# Patient Record
Sex: Male | Born: 1975 | Race: White | Hispanic: No | Marital: Married | Smoking: Never smoker
Health system: Southern US, Community
[De-identification: ages and names within clinical notes are randomized; demographics above are authoritative.]

## PROBLEM LIST (undated history)

## (undated) DIAGNOSIS — M1990 Unspecified osteoarthritis, unspecified site: Secondary | ICD-10-CM

---

## 2006-01-19 ENCOUNTER — Emergency Department (HOSPITAL_COMMUNITY): Admission: EM | Admit: 2006-01-19 | Discharge: 2006-01-19 | Payer: Self-pay | Admitting: Family Medicine

## 2010-01-04 ENCOUNTER — Ambulatory Visit (HOSPITAL_COMMUNITY): Admission: RE | Admit: 2010-01-04 | Discharge: 2010-01-04 | Payer: Self-pay | Admitting: Family Medicine

## 2010-02-02 ENCOUNTER — Ambulatory Visit (HOSPITAL_COMMUNITY): Admission: RE | Admit: 2010-02-02 | Discharge: 2010-02-02 | Payer: Self-pay | Admitting: Family Medicine

## 2012-07-31 ENCOUNTER — Encounter: Payer: Self-pay | Admitting: Family Medicine

## 2012-07-31 ENCOUNTER — Ambulatory Visit (INDEPENDENT_AMBULATORY_CARE_PROVIDER_SITE_OTHER): Payer: BLUE CROSS/BLUE SHIELD | Admitting: Family Medicine

## 2012-07-31 VITALS — BP 110/72 | Temp 98.3°F | Ht 72.0 in | Wt 174.8 lb

## 2012-07-31 DIAGNOSIS — S39012A Strain of muscle, fascia and tendon of lower back, initial encounter: Secondary | ICD-10-CM

## 2012-07-31 DIAGNOSIS — M546 Pain in thoracic spine: Secondary | ICD-10-CM

## 2012-07-31 MED ORDER — DICLOFENAC SODIUM 75 MG PO TBEC: 75.0000 mg | DELAYED_RELEASE_TABLET | Freq: Two times a day (BID) | ORAL | Status: DC

## 2012-07-31 MED ORDER — CHLORZOXAZONE 500 MG PO TABS: 500.0000 mg | ORAL_TABLET | Freq: Four times a day (QID) | ORAL | Status: DC | PRN

## 2012-07-31 NOTE — Patient Instructions (Signed)

## 2012-07-31 NOTE — Progress Notes (Signed)
  Subjective:    Patient ID: Aaron Powers, male    DOB: 1976/04/21, 37 y.o.   MRN: [id number]  Back Pain This is a recurrent problem. The current episode started in the past 7 days. The problem occurs constantly. The problem has been waxing and waning since onset. The pain is present in the lumbar spine. The quality of the pain is described as aching and cramping. The pain does not radiate. The pain is at a severity of 5/10. The pain is moderate. The symptoms are aggravated by bending. Stiffness is present all day. Pertinent negatives include no abdominal pain. Leg pain: some rad to right leg. Risk factors include poor posture. He has tried NSAIDs and analgesics for the symptoms. The treatment provided no relief.      Review of Systems  Gastrointestinal: Negative for abdominal pain.  Musculoskeletal: Positive for back pain.  All other systems reviewed and are negative.       Objective:   Physical Exam  Constitutional: He appears well-developed and well-nourished.  HENT:  Head: Normocephalic and atraumatic.  Eyes: EOM are normal.  Neck: Normal range of motion.  Cardiovascular: Normal rate and regular rhythm.   Pulmonary/Chest: Effort normal.  Musculoskeletal:  Will lumbar tenderness. Left greater than right. Negative straight leg raise. Negative spinal tenderness.          Assessment & Plan:  Acute lumbar strain. Plan chlorzoxazone 500 3 times a day when necessary. Voltaren 75 twice a day when necessary. Local measures discussed. Exercises encouraged.

## 2012-08-01 ENCOUNTER — Encounter: Payer: Self-pay | Admitting: Family Medicine

## 2013-05-04 ENCOUNTER — Telehealth: Payer: Self-pay | Admitting: Family Medicine

## 2013-05-04 MED ORDER — ONDANSETRON 4 MG PO TBDP: 4.0000 mg | ORAL_TABLET | Freq: Four times a day (QID) | ORAL | Status: DC | PRN

## 2013-05-04 NOTE — Telephone Encounter (Signed)
Same rx as bro

## 2013-05-04 NOTE — Telephone Encounter (Signed)
Pt has a stomach bug, wants some zofran called in please to [facility]

## 2013-05-04 NOTE — Telephone Encounter (Signed)
Patient notified and med sent in 

## 2014-02-08 ENCOUNTER — Encounter: Payer: Self-pay | Admitting: Family Medicine

## 2014-02-08 ENCOUNTER — Ambulatory Visit (INDEPENDENT_AMBULATORY_CARE_PROVIDER_SITE_OTHER): Payer: BLUE CROSS/BLUE SHIELD | Admitting: Family Medicine

## 2014-02-08 VITALS — Ht 72.0 in | Wt 177.2 lb

## 2014-02-08 DIAGNOSIS — S83512A Sprain of anterior cruciate ligament of left knee, initial encounter: Secondary | ICD-10-CM

## 2014-02-08 DIAGNOSIS — S83509A Sprain of unspecified cruciate ligament of unspecified knee, initial encounter: Secondary | ICD-10-CM

## 2014-02-08 NOTE — Progress Notes (Signed)
   Subjective:    Patient ID: Aaron Powers, male    DOB: 09/14/1975, 38 y.o.   MRN: [id number]  HPI Patient arrives with left knee pain since yesterday evening. He is 38 years of age in good health he is active with his sons playing soccer. He states he was playing outside stepped in turn when he felt a pop in the knee along with swelling pain and discomfort has never had this before.  Review of Systems     Objective:   Physical Exam Lungs clear heart regular A full knee exam was done. Right knee is normal. Left knee has some swelling moderate joint line tenderness bilateral and has significant laxity with anterior drawer maneuver       Assessment & Plan:  Anterior cruciate ligament injury left knee needs referral to orthopedics. Will discuss case with Dr. Romeo AppleHarrison in set him up. Patient is a good surgical candidate

## 2014-02-09 ENCOUNTER — Telehealth: Payer: Self-pay | Admitting: Family Medicine

## 2014-02-09 NOTE — Telephone Encounter (Signed)
Appointment scheduled for tomorrow morning at 10:15 am per Okey Regalarol at Dr. Romeo AppleHarrison.

## 2014-02-09 NOTE — Telephone Encounter (Signed)
Calling to check on referral to ortho.

## 2014-02-09 NOTE — Telephone Encounter (Signed)
Please try to get appt this week with Dr Romeo AppleHarrison, ( I can talk with him if need be) pt has new ACL injury

## 2014-02-10 ENCOUNTER — Ambulatory Visit (INDEPENDENT_AMBULATORY_CARE_PROVIDER_SITE_OTHER): Payer: BC Managed Care – PPO

## 2014-02-10 ENCOUNTER — Encounter: Payer: Self-pay | Admitting: Orthopedic Surgery

## 2014-02-10 ENCOUNTER — Ambulatory Visit (INDEPENDENT_AMBULATORY_CARE_PROVIDER_SITE_OTHER): Payer: BC Managed Care – PPO | Admitting: Orthopedic Surgery

## 2014-02-10 VITALS — HR 80 | Ht 72.0 in | Wt 177.2 lb

## 2014-02-10 DIAGNOSIS — M25562 Pain in left knee: Secondary | ICD-10-CM | POA: Insufficient documentation

## 2014-02-10 DIAGNOSIS — M238X2 Other internal derangements of left knee: Secondary | ICD-10-CM

## 2014-02-10 DIAGNOSIS — M238X9 Other internal derangements of unspecified knee: Secondary | ICD-10-CM | POA: Insufficient documentation

## 2014-02-10 DIAGNOSIS — S83249A Other tear of medial meniscus, current injury, unspecified knee, initial encounter: Secondary | ICD-10-CM | POA: Insufficient documentation

## 2014-02-10 DIAGNOSIS — S83242A Other tear of medial meniscus, current injury, left knee, initial encounter: Secondary | ICD-10-CM

## 2014-02-10 NOTE — Progress Notes (Signed)
Chief Complaint  Patient presents with  . Knee Injury    Left knee injury while playing with children outside, DOI 02/07/14    Subjective:    Aaron MelenaRaleigh Powers is a 38 y.o. male who presents with a knee injury involving the left knee. Onset was Initially 20 years ago he had an anterior cruciate ligament injury which was treated with bracing and therapy. He presents now with a recent onset of a new injury twisting his left knee pivoting with frequent giving way episode since that time. This injury occurred on September 28. Inciting event: twisting injury while Helping his children practice soccer. Current symptoms include: giving out and swelling. Pain is aggravated by lateral movements, pivoting and squatting. Patient has had prior knee problems. Evaluation to date: none. Treatment to date: none.  The following portions of the patient's history were reviewed and updated as appropriate: allergies, current medications, past family history, past medical history, past social history, past surgical history and problem list.   Review of Systems A comprehensive review of systems was negative.   Objective:    Ht 6' (1.829 m)  Wt 177 lb 3.2 oz (80.377 kg)  BMI 24.03 kg/m2 He is healthy well-developed well-nourished grooming and hygiene are normal he is oriented x3 his mood and affect are pleasant and normal as well.   UPPEREXTREMITIES: He has normal range of motion stability strength and alignment and skin in the upper extremities  Right knee: normal, no effusion, full active range of motion, no joint line tenderness, ligamentous structures intact. and Strength normal  Left knee:  Trace positive joint effusion. Medial joint line tenderness. Full range of motion. 2+ Lachman. Positive McMurray's for medial joint line pain and clicking. Motor strength normal. Skin normal.   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

## 2014-02-14 ENCOUNTER — Ambulatory Visit (HOSPITAL_COMMUNITY)
Admission: RE | Admit: 2014-02-14 | Discharge: 2014-02-14 | Disposition: A | Discharge status: Home / Self Care | Payer: BC Managed Care – PPO | Source: Ambulatory Visit | Attending: Orthopedic Surgery | Admitting: Orthopedic Surgery

## 2014-02-14 ENCOUNTER — Ambulatory Visit (HOSPITAL_COMMUNITY): Payer: BC Managed Care – PPO

## 2014-02-14 DIAGNOSIS — S83282A Other tear of lateral meniscus, current injury, left knee, initial encounter: Secondary | ICD-10-CM | POA: Diagnosis not present

## 2014-02-14 DIAGNOSIS — X58XXXA Exposure to other specified factors, initial encounter: Secondary | ICD-10-CM | POA: Insufficient documentation

## 2014-02-14 DIAGNOSIS — M25562 Pain in left knee: Secondary | ICD-10-CM | POA: Insufficient documentation

## 2014-02-14 DIAGNOSIS — S83242A Other tear of medial meniscus, current injury, left knee, initial encounter: Secondary | ICD-10-CM | POA: Diagnosis not present

## 2014-02-15 ENCOUNTER — Ambulatory Visit (HOSPITAL_COMMUNITY): Payer: BC Managed Care – PPO

## 2014-02-17 ENCOUNTER — Ambulatory Visit (INDEPENDENT_AMBULATORY_CARE_PROVIDER_SITE_OTHER): Payer: BC Managed Care – PPO | Admitting: Orthopedic Surgery

## 2014-02-17 ENCOUNTER — Encounter: Payer: Self-pay | Admitting: Orthopedic Surgery

## 2014-02-17 VITALS — BP 141/83 | Ht 72.0 in | Wt 177.2 lb

## 2014-02-17 DIAGNOSIS — M23304 Other meniscus derangements, unspecified medial meniscus, left knee: Secondary | ICD-10-CM

## 2014-02-17 DIAGNOSIS — M23301 Other meniscus derangements, unspecified lateral meniscus, left knee: Secondary | ICD-10-CM

## 2014-02-17 DIAGNOSIS — M23307 Other meniscus derangements, unspecified meniscus, left knee: Secondary | ICD-10-CM

## 2014-02-17 DIAGNOSIS — M23322 Other meniscus derangements, posterior horn of medial meniscus, left knee: Secondary | ICD-10-CM

## 2014-02-17 DIAGNOSIS — M2352 Chronic instability of knee, left knee: Secondary | ICD-10-CM

## 2014-02-17 NOTE — Patient Instructions (Signed)
SURGERY 02/23/14   You have decided to proceed with operative arthroscopy of the knee. You have decided not to continue with nonoperative measures such as but not limited to oral medication, weight loss, activity modification, physical therapy, bracing, or injection.  We will perform operative arthroscopy of the knee. Some of the risks associated with arthroscopic surgery of the knee include but are not limited to Bleeding Infection Swelling Stiffness Blood clot Pain  If you're not comfortable with these risks and would like to continue with nonoperative treatment please let Dr. Romeo AppleHarrison know prior to your surgery.

## 2014-02-18 ENCOUNTER — Encounter: Payer: Self-pay | Admitting: Orthopedic Surgery

## 2014-02-18 ENCOUNTER — Telehealth: Payer: Self-pay | Admitting: Orthopedic Surgery

## 2014-02-18 DIAGNOSIS — M2350 Chronic instability of knee, unspecified knee: Secondary | ICD-10-CM | POA: Insufficient documentation

## 2014-02-18 DIAGNOSIS — M23329 Other meniscus derangements, posterior horn of medial meniscus, unspecified knee: Secondary | ICD-10-CM | POA: Insufficient documentation

## 2014-02-18 DIAGNOSIS — M23302 Other meniscus derangements, unspecified lateral meniscus, unspecified knee: Secondary | ICD-10-CM | POA: Insufficient documentation

## 2014-02-18 NOTE — Patient Instructions (Addendum)
Marcanthony Fournet  02/18/2014   Your procedure is scheduled on:  02/23/14  [facility] at [facility]:25 AM.  Call this number if you have problems the morning of surgery: [phone]   Remember:   Do not eat food or drink liquids after midnight.   Take these medicines the morning of surgery with A SIP OF WATER: none   Do not wear jewelry, make-up or nail polish.  Do not wear lotions, powders, or perfumes.  Do not shave 48 hours prior to surgery. Men may shave face and neck.  Do not bring valuables to the hospital.  [facility] is not responsible for any belongings or valuables.               Contacts, dentures or bridgework may not be worn into surgery.  Leave suitcase in the car. After surgery it may be brought to your room.  For patients admitted to the hospital, discharge time is determined by your treatment team.               Patients discharged the day of surgery will not be allowed to drive home.    Special Instructions: Shower using Hibiclens bath the night before surgery and the morning of surgery.   Please read over the following fact sheets that you were given: Pain Booklet and Anesthesia Post-op Instructions    Arthroscopic Procedure, Knee An arthroscopic procedure can find what is wrong with your knee. PROCEDURE Arthroscopy is a surgical technique that allows your orthopedic surgeon to diagnose and treat your knee injury with accuracy. They will look into your knee through a small instrument. This is almost like a small (pencil sized) telescope. Because arthroscopy affects your knee less than open knee surgery, you can anticipate a more rapid recovery. Taking an active role by following your caregiver's instructions will help with rapid and complete recovery. Use crutches, rest, elevation, ice, and knee exercises as instructed. The length of recovery depends on various factors including type of injury, age, physical condition, medical conditions, and your  rehabilitation. Your knee is the joint between the large bones (femur and tibia) in your leg. Cartilage covers these bone ends which are smooth and slippery and allow your knee to bend and move smoothly. Two menisci, thick, semi-lunar shaped pads of cartilage which form a rim inside the joint, help absorb shock and stabilize your knee. Ligaments bind the bones together and support your knee joint. Muscles move the joint, help support your knee, and take stress off the joint itself. Because of this all programs and physical therapy to rehabilitate an injured or repaired knee require rebuilding and strengthening your muscles. AFTER THE PROCEDURE  After the procedure, you will be moved to a recovery area until most of the effects of the medication have worn off. Your caregiver will discuss the test results with you.  Only take over-the-counter or prescription medicines for pain, discomfort, or fever as directed by your caregiver. SEEK MEDICAL CARE IF:   You have increased bleeding from your wounds.  You see redness, swelling, or have increasing pain in your wounds.  You have pus coming from your wound.  You have an oral temperature above 102 F (38.9 C).  You notice a bad smell coming from the wound or dressing.  You have severe pain with any motion of your knee. SEEK IMMEDIATE MEDICAL CARE IF:   You develop a rash.  You have difficulty breathing.  You have any allergic problems. Document Released: 04/26/2000  Document Revised: 07/22/2011 Document Reviewed: 11/18/2007 [facility] Patient Information 2015 [place]. This information is not intended to replace advice given to you by your health care provider. Make sure you discuss any questions you have with your health care provider.    PATIENT INSTRUCTIONS POST-ANESTHESIA  IMMEDIATELY FOLLOWING SURGERY:  Do not drive or operate machinery for the first twenty four hours after surgery.  Do not make any important decisions for twenty  four hours after surgery or while taking narcotic pain medications or sedatives.  If you develop intractable nausea and vomiting or a severe headache please notify your doctor immediately.  FOLLOW-UP:  Please make an appointment with your surgeon as instructed. You do not need to follow up with anesthesia unless specifically instructed to do so.  WOUND CARE INSTRUCTIONS (if applicable):  Keep a dry clean dressing on the anesthesia/puncture wound site if there is drainage.  Once the wound has quit draining you may leave it open to air.  Generally you should leave the bandage intact for twenty four hours unless there is drainage.  If the epidural site drains for more than 36-48 hours please call the anesthesia department.  QUESTIONS?:  Please feel free to call your physician or the hospital operator if you have any questions, and they will be happy to assist you.

## 2014-02-18 NOTE — Progress Notes (Signed)
Chief Complaint  Patient presents with  . Results    review MRI results left knee    Reevaluation with MRI report regarding patient's left knee. 38 year old male left knee injury many years ago repeat injury 01/30/2014. History of remote anterior cruciate ligament tear without reconstruction. Presents after twisting injury.  Symptoms have not changed since his evaluation on October 1. 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   Complex tear posterior horn medial meniscus with horizontal tearing seen in the body of the medial meniscus. A small meniscal fragment is displaced over the root of the posterior horn.   Large radial tear posterior horn lateral meniscus. The body of the lateral meniscus demonstrates complex and diffuse degenerative tearing.   Chronic, complete ACL tear.  We basically discussed 2 options one complete anterior cruciate ligament reconstruction with meniscal work as needed with the understanding that the patient would still have arthritic symptoms and that his recovery would be a year with 3 months out of work.  OR meniscectomies understanding that his knee would return to its baseline status prior to this most recent injury  He opted for arthroscopic meniscectomies of the left knee medial and lateral at his convenience.  We discussed the following  You have decided to proceed with operative arthroscopy of the knee. You have decided not to continue with nonoperative measures such as but not limited to oral medication, weight loss, activity modification, physical therapy, bracing, or injection.  We will perform operative arthroscopy of the knee. Some of the risks associated with arthroscopic surgery of the knee include but are not limited to Bleeding Infection Swelling Stiffness Blood clot Pain  If you're not comfortable with these risks and would like to continue  with nonoperative treatment please let Dr. Romeo AppleHarrison know prior to your surgery.  Time spent 15 minutes

## 2014-02-18 NOTE — Telephone Encounter (Signed)
Regarding out-patient surgery scheduled 02/23/14, contacted insurer, [employer], at [phone]; per Gifford ShaveNicole B, no pre-authorization required for out-patient procedures; her name and today's date, 02/28/14 for reference.

## 2014-02-21 ENCOUNTER — Encounter (HOSPITAL_COMMUNITY)
Admission: RE | Admit: 2014-02-21 | Discharge: 2014-02-21 | Disposition: A | Discharge status: Home / Self Care | Payer: BC Managed Care – PPO | Source: Ambulatory Visit | Attending: Orthopedic Surgery | Admitting: Orthopedic Surgery

## 2014-02-21 ENCOUNTER — Encounter (HOSPITAL_COMMUNITY): Payer: Self-pay

## 2014-02-21 ENCOUNTER — Encounter (HOSPITAL_COMMUNITY): Payer: Self-pay | Admitting: Pharmacy Technician

## 2014-02-21 DIAGNOSIS — Y9366 Activity, soccer: Secondary | ICD-10-CM | POA: Diagnosis not present

## 2014-02-21 DIAGNOSIS — Y998 Other external cause status: Secondary | ICD-10-CM | POA: Diagnosis not present

## 2014-02-21 DIAGNOSIS — S83282A Other tear of lateral meniscus, current injury, left knee, initial encounter: Secondary | ICD-10-CM | POA: Diagnosis present

## 2014-02-21 DIAGNOSIS — M25862 Other specified joint disorders, left knee: Secondary | ICD-10-CM | POA: Diagnosis not present

## 2014-02-21 DIAGNOSIS — S83242A Other tear of medial meniscus, current injury, left knee, initial encounter: Secondary | ICD-10-CM | POA: Diagnosis not present

## 2014-02-21 DIAGNOSIS — M94262 Chondromalacia, left knee: Secondary | ICD-10-CM | POA: Diagnosis not present

## 2014-02-21 DIAGNOSIS — Y92007 Garden or yard of unspecified non-institutional (private) residence as the place of occurrence of the external cause: Secondary | ICD-10-CM | POA: Diagnosis not present

## 2014-02-21 LAB — BASIC METABOLIC PANEL
ANION GAP: 12 (ref 5–15)
BUN: 17 mg/dL (ref 6–23)
CALCIUM: 9.5 mg/dL (ref 8.4–10.5)
CHLORIDE: 101 meq/L (ref 96–112)
CO2: 25 mEq/L (ref 19–32)
CREATININE: 0.83 mg/dL (ref 0.50–1.35)
GFR calc Af Amer: >90 mL/min (ref >90)
Glucose, Bld: 139 mg/dL — ABNORMAL HIGH (ref 70–99)
POTASSIUM: 3.8 meq/L (ref 3.7–5.3)
Sodium: 138 mEq/L (ref 137–147)

## 2014-02-21 LAB — HEMOGLOBIN AND HEMATOCRIT, BLOOD
HEMATOCRIT: 41.1 % (ref 39.0–52.0)
HEMOGLOBIN: 14.7 g/dL (ref 13.0–17.0)

## 2014-02-21 NOTE — Pre-Procedure Instructions (Signed)
Patient given information to sign up for my chart at home. 

## 2014-02-21 NOTE — H&P (Signed)
  Chief Complaint   Patient presents with   .  Knee Injury       Left knee injury while playing with children outside, DOI 02/07/14       Subjective:     Aaron Powers is a 38 y.o. male who presents with a knee injury involving the left knee. Onset was Initially 20 years ago he had an anterior cruciate ligament injury which was treated with bracing and therapy. He presents now with a recent onset of a new injury twisting his left knee pivoting with frequent giving way episode since that time. This injury occurred on September 28. Inciting event: twisting injury while Helping his children practice soccer. Current symptoms include: giving out and swelling. Pain is aggravated by lateral movements, pivoting and squatting. Patient has had prior knee problems. Evaluation to date: none. Treatment to date: none.  The following portions of the patient's history were reviewed and updated as appropriate: allergies, current medications, past family history, past medical history, past social history, past surgical history and problem list.   Review of Systems A comprehensive review of systems was negative.     Objective:     Ht 6' (1.829 m)  Wt 177 lb 3.2 oz (80.377 kg)  BMI 24.03 kg/m2 He is healthy well-developed well-nourished grooming and hygiene are normal he is oriented x3 his mood and affect are pleasant and normal as well.   UPPEREXTREMITIES: He has normal range of motion stability strength and alignment and skin in the upper extremities    Right knee:  normal, no effusion, full active range of motion, no joint line tenderness, ligamentous structures intact. and Strength normal   Left knee:   Trace positive joint effusion. Medial joint line tenderness. Full range of motion. 2+ Lachman. Positive McMurray's for medial joint line pain and clicking. Motor strength normal. Skin normal.    SKIN normal x4 extremities  CV normal x4 extremities  LYMPH normal popliteal fossa groin  bilateral  SENSATION normal both lower and upper extremities  DTR normal upper and lower extremities  COORDINATION BALANCE normal   X-ray left knee: Moderate amount of arthritis,  advanced for age most likely related to previous anterior cruciate ligament chronic injury   MRI report as noted below my interpretation is that he has osteoarthritis and torn medial and lateral meniscus and chronic anterior cruciate ligament tear. 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   Complex tear posterior horn medial meniscus with horizontal tearing seen in the body of the medial meniscus. A small meniscal fragment is displaced over the root of the posterior horn.   Large radial tear posterior horn lateral meniscus. The body of the lateral meniscus demonstrates complex and diffuse degenerative tearing.   Chronic, complete ACL tear.     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 I would also like to do an exam under anesthesia prior to arthroscopy.

## 2014-02-23 ENCOUNTER — Ambulatory Visit (HOSPITAL_COMMUNITY): Payer: BC Managed Care – PPO | Admitting: Anesthesiology

## 2014-02-23 ENCOUNTER — Encounter (HOSPITAL_COMMUNITY): Payer: BC Managed Care – PPO | Admitting: Anesthesiology

## 2014-02-23 ENCOUNTER — Ambulatory Visit (HOSPITAL_COMMUNITY)
Admission: RE | Admit: 2014-02-23 | Discharge: 2014-02-23 | Disposition: A | Discharge status: Home / Self Care | Payer: BC Managed Care – PPO | Source: Ambulatory Visit | Attending: Orthopedic Surgery | Admitting: Orthopedic Surgery

## 2014-02-23 ENCOUNTER — Encounter (HOSPITAL_COMMUNITY): Payer: Self-pay | Admitting: *Deleted

## 2014-02-23 ENCOUNTER — Encounter (HOSPITAL_COMMUNITY)
Admission: RE | Disposition: A | Discharge status: Home / Self Care | Payer: Self-pay | Source: Ambulatory Visit | Attending: Orthopedic Surgery

## 2014-02-23 DIAGNOSIS — Y998 Other external cause status: Secondary | ICD-10-CM | POA: Insufficient documentation

## 2014-02-23 DIAGNOSIS — S83282A Other tear of lateral meniscus, current injury, left knee, initial encounter: Secondary | ICD-10-CM | POA: Diagnosis not present

## 2014-02-23 DIAGNOSIS — S83242A Other tear of medial meniscus, current injury, left knee, initial encounter: Secondary | ICD-10-CM | POA: Insufficient documentation

## 2014-02-23 DIAGNOSIS — M94262 Chondromalacia, left knee: Secondary | ICD-10-CM | POA: Insufficient documentation

## 2014-02-23 DIAGNOSIS — Y9366 Activity, soccer: Secondary | ICD-10-CM | POA: Insufficient documentation

## 2014-02-23 DIAGNOSIS — Y92007 Garden or yard of unspecified non-institutional (private) residence as the place of occurrence of the external cause: Secondary | ICD-10-CM | POA: Insufficient documentation

## 2014-02-23 DIAGNOSIS — M23301 Other meniscus derangements, unspecified lateral meniscus, left knee: Secondary | ICD-10-CM

## 2014-02-23 DIAGNOSIS — M25862 Other specified joint disorders, left knee: Secondary | ICD-10-CM | POA: Insufficient documentation

## 2014-02-23 DIAGNOSIS — M238X2 Other internal derangements of left knee: Secondary | ICD-10-CM

## 2014-02-23 DIAGNOSIS — S83242D Other tear of medial meniscus, current injury, left knee, subsequent encounter: Secondary | ICD-10-CM

## 2014-02-23 HISTORY — PX: MENISECTOMY: SHX5181

## 2014-02-23 HISTORY — PX: KNEE ARTHROSCOPY WITH MEDIAL MENISECTOMY: SHX5651

## 2014-02-23 SURGERY — ARTHROSCOPY, KNEE, WITH MEDIAL MENISCECTOMY
Anesthesia: General | Site: Knee | Laterality: Left

## 2014-02-23 MED ORDER — HYDROCODONE-ACETAMINOPHEN 5-325 MG PO TABS
ORAL_TABLET | ORAL | Status: AC
  Filled 2014-02-23: qty 1

## 2014-02-23 MED ORDER — SODIUM CHLORIDE 0.9 % IJ SOLN
INTRAMUSCULAR | Status: AC
  Filled 2014-02-23: qty 10

## 2014-02-23 MED ORDER — ONDANSETRON HCL 4 MG/2ML IJ SOLN
4.0000 mg | Freq: Once | INTRAMUSCULAR | Status: AC
  Administered 2014-02-23: 4 mg via INTRAVENOUS

## 2014-02-23 MED ORDER — KETOROLAC TROMETHAMINE 30 MG/ML IJ SOLN
30.0000 mg | Freq: Once | INTRAMUSCULAR | Status: AC
Start: 2014-02-23 — End: 2014-02-23
  Administered 2014-02-23: 30 mg via INTRAVENOUS

## 2014-02-23 MED ORDER — ONDANSETRON HCL 4 MG/2ML IJ SOLN
INTRAMUSCULAR | Status: AC
  Filled 2014-02-23: qty 2

## 2014-02-23 MED ORDER — MIDAZOLAM HCL 5 MG/5ML IJ SOLN
INTRAMUSCULAR | Status: DC | PRN
  Administered 2014-02-23: 2 mg via INTRAVENOUS

## 2014-02-23 MED ORDER — FENTANYL CITRATE 0.05 MG/ML IJ SOLN
INTRAMUSCULAR | Status: AC
  Filled 2014-02-23: qty 2

## 2014-02-23 MED ORDER — EPHEDRINE SULFATE 50 MG/ML IJ SOLN
INTRAMUSCULAR | Status: DC | PRN
  Administered 2014-02-23: 10 mg via INTRAVENOUS

## 2014-02-23 MED ORDER — SODIUM CHLORIDE 0.9 % IR SOLN
Status: DC | PRN
  Administered 2014-02-23 (×6)

## 2014-02-23 MED ORDER — MIDAZOLAM HCL 2 MG/2ML IJ SOLN
INTRAMUSCULAR | Status: AC
  Filled 2014-02-23: qty 2

## 2014-02-23 MED ORDER — KETOROLAC TROMETHAMINE 30 MG/ML IJ SOLN
INTRAMUSCULAR | Status: AC
  Filled 2014-02-23: qty 1

## 2014-02-23 MED ORDER — FENTANYL CITRATE 0.05 MG/ML IJ SOLN
25.0000 ug | INTRAMUSCULAR | Status: AC
  Administered 2014-02-23 (×2): 25 ug via INTRAVENOUS

## 2014-02-23 MED ORDER — FENTANYL CITRATE 0.05 MG/ML IJ SOLN
INTRAMUSCULAR | Status: DC | PRN
  Administered 2014-02-23 (×2): 50 ug via INTRAVENOUS

## 2014-02-23 MED ORDER — LACTATED RINGERS IV SOLN
INTRAVENOUS | Status: DC
  Administered 2014-02-23: 1000 mL via INTRAVENOUS

## 2014-02-23 MED ORDER — FENTANYL CITRATE 0.05 MG/ML IJ SOLN
25.0000 ug | INTRAMUSCULAR | Status: DC | PRN
  Administered 2014-02-23 (×2): 50 ug via INTRAVENOUS
  Filled 2014-02-23: qty 2

## 2014-02-23 MED ORDER — PROPOFOL 10 MG/ML IV BOLUS
INTRAVENOUS | Status: DC | PRN
  Administered 2014-02-23: 160 mg via INTRAVENOUS

## 2014-02-23 MED ORDER — SODIUM CHLORIDE 0.9 % IR SOLN
Status: DC | PRN
  Administered 2014-02-23: 500 mL

## 2014-02-23 MED ORDER — LIDOCAINE HCL 1 % IJ SOLN
INTRAMUSCULAR | Status: DC | PRN
  Administered 2014-02-23: 50 mg via INTRADERMAL

## 2014-02-23 MED ORDER — HYDROCODONE-ACETAMINOPHEN 7.5-325 MG PO TABS: 1.0000 | ORAL_TABLET | ORAL | Status: DC | PRN

## 2014-02-23 MED ORDER — BUPIVACAINE-EPINEPHRINE (PF) 0.5% -1:200000 IJ SOLN
INTRAMUSCULAR | Status: AC
  Filled 2014-02-23: qty 30

## 2014-02-23 MED ORDER — EPINEPHRINE HCL 1 MG/ML IJ SOLN
INTRAMUSCULAR | Status: AC
  Filled 2014-02-23: qty 5

## 2014-02-23 MED ORDER — EPHEDRINE SULFATE 50 MG/ML IJ SOLN
INTRAMUSCULAR | Status: AC
  Filled 2014-02-23: qty 1

## 2014-02-23 MED ORDER — CEFAZOLIN SODIUM-DEXTROSE 2-3 GM-% IV SOLR
2.0000 g | INTRAVENOUS | Status: AC
  Administered 2014-02-23: 2 g via INTRAVENOUS
  Filled 2014-02-23: qty 50

## 2014-02-23 MED ORDER — HYDROCODONE-ACETAMINOPHEN 5-325 MG PO TABS
1.0000 | ORAL_TABLET | Freq: Once | ORAL | Status: AC
  Administered 2014-02-23: 1 via ORAL

## 2014-02-23 MED ORDER — MIDAZOLAM HCL 2 MG/2ML IJ SOLN
1.0000 mg | INTRAMUSCULAR | Status: DC | PRN
  Administered 2014-02-23 (×2): 2 mg via INTRAVENOUS

## 2014-02-23 MED ORDER — ONDANSETRON HCL 4 MG/2ML IJ SOLN: 4.0000 mg | Freq: Once | INTRAMUSCULAR | Status: DC | PRN

## 2014-02-23 MED ORDER — CHLORHEXIDINE GLUCONATE 4 % EX LIQD: 60.0000 mL | Freq: Once | CUTANEOUS | Status: DC

## 2014-02-23 MED ORDER — PROMETHAZINE HCL 12.5 MG PO TABS: 12.5000 mg | ORAL_TABLET | Freq: Four times a day (QID) | ORAL | Status: DC | PRN

## 2014-02-23 MED ORDER — ONDANSETRON HCL 4 MG/2ML IJ SOLN
INTRAMUSCULAR | Status: AC
Start: 2014-02-23 — End: 2014-02-23
  Filled 2014-02-23: qty 2

## 2014-02-23 MED ORDER — PROPOFOL 10 MG/ML IV BOLUS
INTRAVENOUS | Status: AC
  Filled 2014-02-23: qty 20

## 2014-02-23 MED ORDER — BUPIVACAINE-EPINEPHRINE (PF) 0.5% -1:200000 IJ SOLN
INTRAMUSCULAR | Status: DC | PRN
  Administered 2014-02-23: 60 mL via PERINEURAL

## 2014-02-23 SURGICAL SUPPLY — 57 items
ARTHROWAND PARAGON T2 (SURGICAL WAND)
BAG HAMPER (MISCELLANEOUS) ×2 IMPLANT
BANDAGE ELASTIC 6 VELCRO NS (GAUZE/BANDAGES/DRESSINGS) ×2 IMPLANT
BLADE 11 SAFETY STRL DISP (BLADE) ×2 IMPLANT
BLADE AGGRESSIVE PLUS 4.0 (BLADE) ×2 IMPLANT
BNDG CMPR 82X61 PLY HI ABS (GAUZE/BANDAGES/DRESSINGS) ×1
BNDG CONFORM 6X.82 1P STRL (GAUZE/BANDAGES/DRESSINGS) ×2 IMPLANT
CHLORAPREP W/TINT 26ML (MISCELLANEOUS) ×3 IMPLANT
CLOTH BEACON ORANGE TIMEOUT ST (SAFETY) ×2 IMPLANT
COOLER CRYO IC GRAV AND TUBE (ORTHOPEDIC SUPPLIES) ×2 IMPLANT
CUFF CRYO KNEE LG 20X31 COOLER (ORTHOPEDIC SUPPLIES) IMPLANT
CUFF CRYO KNEE18X23 MED (MISCELLANEOUS) ×1 IMPLANT
CUFF TOURNIQUET SINGLE 34IN LL (TOURNIQUET CUFF) ×1 IMPLANT
CUFF TOURNIQUET SINGLE 44IN (TOURNIQUET CUFF) IMPLANT
CUTTER ANGLED DBL BITE 4.5 (BURR) IMPLANT
DECANTER SPIKE VIAL GLASS SM (MISCELLANEOUS) ×4 IMPLANT
GAUZE SPONGE 4X4 12PLY STRL (GAUZE/BANDAGES/DRESSINGS) ×2 IMPLANT
GAUZE SPONGE 4X4 16PLY XRAY LF (GAUZE/BANDAGES/DRESSINGS) ×2 IMPLANT
GAUZE XEROFORM 5X9 LF (GAUZE/BANDAGES/DRESSINGS) ×2 IMPLANT
GLOVE EXAM NITRILE PF MED BLUE (GLOVE) ×1 IMPLANT
GLOVE INDICATOR 7.0 STRL GRN (GLOVE) ×1 IMPLANT
GLOVE SKINSENSE NS SZ8.0 LF (GLOVE) ×1
GLOVE SKINSENSE STRL SZ8.0 LF (GLOVE) ×1 IMPLANT
GLOVE SS BIOGEL STRL SZ 6.5 (GLOVE) IMPLANT
GLOVE SS N UNI LF 8.5 STRL (GLOVE) ×2 IMPLANT
GLOVE SUPERSENSE BIOGEL SZ 6.5 (GLOVE) ×1
GOWN STRL REUS W/TWL LRG LVL3 (GOWN DISPOSABLE) ×4 IMPLANT
GOWN STRL REUS W/TWL XL LVL3 (GOWN DISPOSABLE) ×2 IMPLANT
HLDR LEG FOAM (MISCELLANEOUS) ×1 IMPLANT
IV NS IRRIG 3000ML ARTHROMATIC (IV SOLUTION) ×12 IMPLANT
KIT BLADEGUARD II DBL (SET/KITS/TRAYS/PACK) ×2 IMPLANT
KIT ROOM TURNOVER AP CYSTO (KITS) ×2 IMPLANT
LEG HOLDER FOAM (MISCELLANEOUS) ×1
MANIFOLD NEPTUNE II (INSTRUMENTS) ×2 IMPLANT
MARKER SKIN DUAL TIP RULER LAB (MISCELLANEOUS) ×2 IMPLANT
NDL HYPO 18GX1.5 BLUNT FILL (NEEDLE) ×1 IMPLANT
NDL HYPO 21X1.5 SAFETY (NEEDLE) ×1 IMPLANT
NDL SPNL 18GX3.5 QUINCKE PK (NEEDLE) ×1 IMPLANT
NEEDLE HYPO 18GX1.5 BLUNT FILL (NEEDLE) ×2 IMPLANT
NEEDLE HYPO 21X1.5 SAFETY (NEEDLE) ×2 IMPLANT
NEEDLE SPNL 18GX3.5 QUINCKE PK (NEEDLE) ×2 IMPLANT
NS IRRIG 1000ML POUR BTL (IV SOLUTION) ×2 IMPLANT
PACK ARTHRO LIMB DRAPE STRL (MISCELLANEOUS) ×2 IMPLANT
PAD ABD 5X9 TENDERSORB (GAUZE/BANDAGES/DRESSINGS) ×2 IMPLANT
PAD ARMBOARD 7.5X6 YLW CONV (MISCELLANEOUS) ×2 IMPLANT
PADDING CAST COTTON 6X4 STRL (CAST SUPPLIES) ×2 IMPLANT
SET ARTHROSCOPY INST (INSTRUMENTS) ×2 IMPLANT
SET ARTHROSCOPY PUMP TUBE (IRRIGATION / IRRIGATOR) ×2 IMPLANT
SET BASIN LINEN APH (SET/KITS/TRAYS/PACK) ×2 IMPLANT
SPONGE GAUZE 4X4 12PLY (GAUZE/BANDAGES/DRESSINGS) ×2 IMPLANT
SUT ETHILON 3 0 FSL (SUTURE) IMPLANT
SYR 30ML LL (SYRINGE) ×2 IMPLANT
SYRINGE 12CC LL (MISCELLANEOUS) ×2 IMPLANT
WAND 50 DEG COVAC W/CORD (SURGICAL WAND) IMPLANT
WAND 90 DEG TURBOVAC W/CORD (SURGICAL WAND) ×1 IMPLANT
WAND ARTHRO PARAGON T2 (SURGICAL WAND) IMPLANT
YANKAUER SUCT BULB TIP 10FT TU (MISCELLANEOUS) ×6 IMPLANT

## 2014-02-23 NOTE — Interval H&P Note (Signed)
History and Physical Interval Note:  02/23/2014 9:54 AM  Aaron Powers  has presented today for surgery, with the diagnosis of left medial and lateral meniscus tears  The various methods of treatment have been discussed with the patient and family. After consideration of risks, benefits and other options for treatment, the patient has consented to  Procedure(s) with comments: KNEE ARTHROSCOPY WITH MEDIAL MENISECTOMY (Left) - medial and lateral menisectomy as a surgical intervention .  The patient's history has been reviewed, patient examined, no change in status, stable for surgery.  I have reviewed the patient's chart and labs.  Questions were answered to the patient's satisfaction.     Aaron Powers

## 2014-02-23 NOTE — Discharge Instructions (Signed)
Arthroscopic Procedure, Knee, Care After °Refer to this sheet in the next few weeks. These discharge instructions provide you with general information on caring for yourself after you leave the hospital. Your health care provider may also give you specific instructions. Your treatment has been planned according to the most current medical practices available, but unavoidable complications sometimes occur. If you have any problems or questions after discharge, please call your health care provider. °HOME CARE INSTRUCTIONS  °· It is normal to be sore for a couple days after surgery. See your health care provider if this seems to be getting worse rather than better. °· Only take over-the-counter or prescription medicines for pain, discomfort, or fever as directed by your health care provider. °· Take showers rather than baths, or as directed by your health care provider. °· Change bandages (dressings) if necessary or as directed. °· You may resume normal diet and activities as directed or allowed. °· Avoid lifting and driving until you are directed otherwise. °· Make an appointment to see your health care provider for stitches (suture) or staple removal as directed. °· You may put ice on the area. °· Put the ice in a plastic bag. Place a towel between your skin and the bag. °· Leave the ice on for 15-20 minutes, three to four times per day for the first 2 days. °· Elevate the knee above the level of your heart to reduce swelling, and avoid dangling the leg. °· Do 10-15 ankle pumps (pointing your toes toward you and then away from you) two to three times daily. °· If you are given compression stockings to wear after surgery, use them for as long as your surgeon tells you (around 10-14 days). °· Avoid smoking and exposure to secondhand smoke. °SEEK MEDICAL CARE IF:  °· You have increased bleeding from your wounds. °· You see redness or swelling or you have increasing pain in your wounds. °· You have pus coming from your  wound. °· You have a fever or persistent symptoms for more than 2-3 days. °· You notice a bad smell coming from the wound or dressing. °· You have severe pain with any motion of your knee. °SEEK IMMEDIATE MEDICAL CARE IF:  °· You develop a rash. °· You have difficulty breathing. °· You develop any reaction or side effects to medicines taken. °· You develop pain in the calves or back of the knee. °· You develop chest pain, shortness of breath, or difficulty breathing. °· You develop numbness or tingling in the leg or foot. °MAKE SURE YOU:  °· Understand these instructions. °· Will watch your condition. °· Will get help right away if you are not doing well or you get worse. °Document Released: 11/16/2004 Document Revised: 05/04/2013 Document Reviewed: 09/24/2012 °ExitCare® Patient Information ©2015 [employer], LLC. This information is not intended to replace advice given to you by your health care provider. Make sure you discuss any questions you have with your health care provider. ° °

## 2014-02-23 NOTE — Brief Op Note (Signed)
02/23/2014  11:09 AM  PATIENT:  Aaron Powers  38 y.o. male  PRE-OPERATIVE DIAGNOSIS:  left medial and lateral meniscus tears  POST-OPERATIVE DIAGNOSIS:  left medial and lateral meniscus tears, chronic ACL tear  PROCEDURE:  Procedure(s) with comments: KNEE ARTHROSCOPY  (Left) - medial and lateral menisectomy  Surgical findings: The patient had exam under anesthesia.  He had a 2+ Lachman and a  grade 1 pivot shift.  Intraoperatively we found chronic torn lateral meniscus and a  subacute tear medial meniscus.  We found the anterior cruciate ligament scarred to the PCL indicating a previous proximal detachment.  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  SURGEON:  Surgeon(s) and Role:    * Vickki HearingStanley E Kim Lauver, MD - Primary  PHYSICIAN ASSISTANT:   ASSISTANTS: none   ANESTHESIA:   general  EBL:  Total I/O In: 400 [I.V.:400] Out: -   BLOOD ADMINISTERED:none  DRAINS: none   LOCAL MEDICATIONS USED:  MARCAINE     SPECIMEN:  No Specimen  DISPOSITION OF SPECIMEN:  N/A  COUNTS:  YES  TOURNIQUET:    DICTATION: .Dragon Dictation  PLAN OF CARE: Discharge to home after PACU  PATIENT DISPOSITION:  PACU - hemodynamically stable.   Delay start of Pharmacological VTE agent (>24hrs) due to surgical blood loss or risk of bleeding: not applicable

## 2014-02-23 NOTE — Anesthesia Preprocedure Evaluation (Signed)
Anesthesia Evaluation  Patient identified by MRN, date of birth, ID band Patient awake    Reviewed: Allergy & Precautions, H&P , NPO status , Patient's Chart, lab work & pertinent test results  Airway Mallampati: I  TM Distance: >3 FB Neck ROM: Full    Dental  (+) Teeth Intact   Pulmonary neg pulmonary ROS,  breath sounds clear to auscultation        Cardiovascular negative cardio ROS  Rhythm:Regular Rate:Normal     Neuro/Psych    GI/Hepatic negative GI ROS,   Endo/Other    Renal/GU      Musculoskeletal   Abdominal   Peds  Hematology   Anesthesia Other Findings   Reproductive/Obstetrics                             Anesthesia Physical Anesthesia Plan  ASA: I  Anesthesia Plan: General   Post-op Pain Management:    Induction: Intravenous  Airway Management Planned: LMA  Additional Equipment:   Intra-op Plan:   Post-operative Plan: Extubation in OR  Informed Consent: I have reviewed the patients History and Physical, chart, labs and discussed the procedure including the risks, benefits and alternatives for the proposed anesthesia with the patient or authorized representative who has indicated his/her understanding and acceptance.     Plan Discussed with:   Anesthesia Plan Comments:         Anesthesia Quick Evaluation  

## 2014-02-23 NOTE — Op Note (Signed)
02/23/2014  11:09 AM  PATIENT:  Aaron Powers  38 y.o. male  PRE-OPERATIVE DIAGNOSIS:  left medial and lateral meniscus tears  POST-OPERATIVE DIAGNOSIS:  left medial and lateral meniscus tears, chronic ACL tear  PROCEDURE:  Procedure(s) with comments: KNEE ARTHROSCOPY  (Left) - medial and lateral menisectomy  Surgical findings: The patient had exam under anesthesia.  He had a 2+ Lachman and a  grade 1 pivot shift.  Intraoperatively we found chronic torn lateral meniscus and a  subacute tear medial meniscus.  We found the anterior cruciate ligament scarred to the PCL indicating a previous proximal detachment.  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  SURGEON:  Surgeon(s) and Role:    * Vickki HearingStanley E Ashlinn Hemrick, MD - Primary  PHYSICIAN ASSISTANT:   ASSISTANTS: none   ANESTHESIA:   general  EBL:  Total I/O In: 400 [I.V.:400] Out: -   BLOOD ADMINISTERED:none  DRAINS: none   LOCAL MEDICATIONS USED:  MARCAINE     SPECIMEN:  No Specimen  DISPOSITION OF SPECIMEN:  N/A  COUNTS:  YES  TOURNIQUET:    DICTATION: .Dragon Dictation  PLAN OF CARE: Discharge to home after PACU  PATIENT DISPOSITION:  PACU - hemodynamically stable.   Delay start of Pharmacological VTE agent (>24hrs) due to surgical blood loss or risk of bleeding: not applicable  Details of procedure:  the patient was identified in the preop holding area using to approve identification parameters. The surgical site was confirmed and the site was marked. Chart completed.  Patient taken to the operating room for general anesthesia. He was in the supine position the left leg arthroscopic leg holder and the right leg was padded and placed on a padded leg holder. After sterile prep and drape timeout was completed.  Lateral portal was established scope was placed into the joint. A medial portal was established probe was placed  in the joint. Diagnostic arthroscopy was completed with circumferential viewing of the suprapatellar pouch medial gutter medial joint notch lateral gutter.  This was repeated.  We first addressed the medial meniscus we did a medial meniscectomy using a shaver and a 90 ArthroCare wand. We did a brief chondroplasty of the medial femoral condyle for grade 1 chondromalacia.  We then addressed the notch. We noted the absent lateral wall sign. Anterior cruciate ligament tissue scar to PCL. And some fibers of the posterior lateral bundle intact at its anatomic site. The remaining tissue was scarred to the PCL.  We then adjust the lateral compartment the posterior horn and a large oblique tear. Some of the meniscal tissue was missing. The posterior horn was debrided and the meniscal fragments were removed. A probe was used to confirm a stable rim.  We turned our attention to the patellofemoral joint which was normal except for one 5 mm area of degenerative cartilage grade 3.  The shaver was used to smooth this area over. We also placed a shaver and the lateral compartment to do a brief chondroplasty of grade 2 lesion of the lateral femoral condyle  After thorough irrigation the joint was injected with 60 cc of Marcaine and the portals were closed with 2-0 nylon suture  Sterile dressing applied  Cryo/Cuff was activated and applied  Patient was extubated and taken to the recovery room in stable condition  The postoperative plan is for immediate weightbearing as tolerated with crutches. Start knee exercises on Friday.

## 2014-02-23 NOTE — Transfer of Care (Signed)
Immediate Anesthesia Transfer of Care Note  Patient: Aaron Powers  Procedure(s) Performed: Procedure(s) with comments: KNEE ARTHROSCOPY  (Left) - medial and lateral menisectomy Medial and Lateral MENISECTOMY (Left)  Patient Location: PACU  Anesthesia Type:General  Level of Consciousness: awake and patient cooperative  Airway & Oxygen Therapy: Patient Spontanous Breathing and Patient connected to face mask oxygen  Post-op Assessment: Report given to PACU RN, Post -op Vital signs reviewed and stable and Patient moving all extremities  Post vital signs: Reviewed and stable  Complications: No apparent anesthesia complications

## 2014-02-23 NOTE — Anesthesia Postprocedure Evaluation (Signed)
  Anesthesia Post-op Note  Patient: Aaron Powers  Procedure(s) Performed: Procedure(s) with comments: KNEE ARTHROSCOPY  (Left) - medial and lateral menisectomy Medial and Lateral MENISECTOMY (Left)  Patient Location: PACU  Anesthesia Type:General  Level of Consciousness: awake, alert , oriented and patient cooperative  Airway and Oxygen Therapy: Patient Spontanous Breathing  Post-op Pain: 2 /10, mild  Post-op Assessment: Post-op Vital signs reviewed, Patient's Cardiovascular Status Stable, Respiratory Function Stable, Patent Airway, No signs of Nausea or vomiting and Pain level controlled  Post-op Vital Signs: Reviewed and stable  Last Vitals:  Filed Vitals:   02/23/14 1200  BP:   Pulse: 55  Temp:   Resp: 10    Complications: No apparent anesthesia complications

## 2014-02-23 NOTE — Anesthesia Procedure Notes (Signed)
Procedure Name: LMA Insertion Date/Time: 02/23/2014 10:11 AM Performed by: Despina HiddenIDACAVAGE, Zair Borawski J Pre-anesthesia Checklist: Emergency Drugs available, Patient identified, Suction available and Patient being monitored Patient Re-evaluated:Patient Re-evaluated prior to inductionOxygen Delivery Method: Circle system utilized Preoxygenation: Pre-oxygenation with 100% oxygen Intubation Type: IV induction Ventilation: Mask ventilation without difficulty LMA: LMA inserted LMA Size: 4.0 Grade View: Grade I Tube type: Oral Number of attempts: 1 Placement Confirmation: breath sounds checked- equal and bilateral and positive ETCO2 Tube secured with: Tape Dental Injury: Teeth and Oropharynx as per pre-operative assessment

## 2014-02-24 ENCOUNTER — Encounter (HOSPITAL_COMMUNITY): Payer: Self-pay | Admitting: Orthopedic Surgery

## 2014-02-28 ENCOUNTER — Encounter: Payer: Self-pay | Admitting: Orthopedic Surgery

## 2014-02-28 ENCOUNTER — Ambulatory Visit: Payer: BC Managed Care – PPO | Admitting: Orthopedic Surgery

## 2014-02-28 ENCOUNTER — Ambulatory Visit (INDEPENDENT_AMBULATORY_CARE_PROVIDER_SITE_OTHER): Payer: BC Managed Care – PPO | Admitting: Orthopedic Surgery

## 2014-02-28 VITALS — BP 116/43 | Ht 72.0 in | Wt 179.0 lb

## 2014-02-28 DIAGNOSIS — Z9889 Other specified postprocedural states: Secondary | ICD-10-CM | POA: Insufficient documentation

## 2014-02-28 NOTE — Progress Notes (Signed)
Subjective:   Chief Complaint  Patient presents with  . Follow-up    Post op #1, left knee. DOS 02-23-14.     [facility] after left knee arthroscopy surgery. Findings at surgery: medial meniscus tear, lateral meniscus tear, old anterior cruciate ligament tear with proximal attachment and scarring to the PCL as well as chondral changes. Complains of a dull aching sensation in the left knee The patient denies fever, wound drainage, increasing redness, pus, increasing pain, increasing swelling. Post op problems reported: none He is ambulating well.     Objective:      General :    alert and cooperative  Gait:  Abnormal.  Sutures:   Sutures in place and will be removed today.  Incision:  healing well, no significant drainage, no dehiscence, no significant erythema  Tenderness:  none  Flexion ROM:  90  Extension ROM:  full range with pain  Effusion:  mild  DVT Evaluation:  No evidence of DVT seen on physical exam.    Assessment:    Status post left knee arthroscopy Doing well postoperatively.    Plan:    Physical Therapy for post-operative rehabilitation. Full weight bearing. Follow up: 3 weeks.

## 2014-02-28 NOTE — Patient Instructions (Signed)
[facility] [place]

## 2014-03-21 ENCOUNTER — Encounter: Payer: Self-pay | Admitting: Orthopedic Surgery

## 2014-03-21 ENCOUNTER — Ambulatory Visit (INDEPENDENT_AMBULATORY_CARE_PROVIDER_SITE_OTHER): Payer: BC Managed Care – PPO | Admitting: Orthopedic Surgery

## 2014-03-21 VITALS — BP 126/82 | Ht 72.0 in | Wt 179.0 lb

## 2014-03-21 DIAGNOSIS — M23304 Other meniscus derangements, unspecified medial meniscus, left knee: Secondary | ICD-10-CM

## 2014-03-21 DIAGNOSIS — Z9889 Other specified postprocedural states: Secondary | ICD-10-CM

## 2014-03-21 DIAGNOSIS — M23307 Other meniscus derangements, unspecified meniscus, left knee: Secondary | ICD-10-CM

## 2014-03-21 DIAGNOSIS — M23301 Other meniscus derangements, unspecified lateral meniscus, left knee: Secondary | ICD-10-CM

## 2014-03-21 DIAGNOSIS — M23322 Other meniscus derangements, posterior horn of medial meniscus, left knee: Secondary | ICD-10-CM

## 2014-03-21 DIAGNOSIS — M2352 Chronic instability of knee, left knee: Secondary | ICD-10-CM

## 2014-03-21 NOTE — Progress Notes (Signed)
Patient ID: [name], male   DOB: 05/11/1976, 38 y.o.   MRN: [id number] Chief Complaint  Patient presents with  . Follow-up    3 week recheck on post op #2, left knee SALK. DOS 02-23-14.   BP 126/82 mmHg  Ht 6' (1.829 m)  Wt 179 lb (81.194 kg)  BMI 24.27 kg/m2  Encounter Diagnoses  Name Primary?  . S/P left knee arthroscopy Yes  . Medial meniscus, posterior horn derangement, left   . Old anterior cruciate ligament disruption, left   . Meniscus, lateral, derangement, left     The patient is doing well. He says his knee feels very stable. However on examination is a very positive Lachman test but no pivoting.the knee is quiet today no swelling no effusion full range of motion.  Recommend economy hinged brace, activity modification continued home exercises and follow-up if any problems with his knee.

## 2014-03-21 NOTE — Patient Instructions (Addendum)
Continue home exercises Return to work 03/28/14

## 2014-03-30 ENCOUNTER — Encounter: Payer: BLUE CROSS/BLUE SHIELD | Admitting: Family Medicine

## 2014-04-05 ENCOUNTER — Telehealth: Payer: Self-pay | Admitting: Family Medicine

## 2014-04-05 ENCOUNTER — Other Ambulatory Visit: Payer: Self-pay | Admitting: *Deleted

## 2014-04-05 MED ORDER — AZITHROMYCIN 250 MG PO TABS: ORAL_TABLET | ORAL | Status: DC

## 2014-04-05 NOTE — Telephone Encounter (Signed)
pt calling to say he is having a sore throat and headaches Mom was diagnosed with strep on 11/20, she says you told them  To call us for a script to be called in for any family members who  Come down with the same thing an you would call in a antibiotic for them too  [facility]

## 2014-04-05 NOTE — Telephone Encounter (Signed)
zpk

## 2014-04-05 NOTE — Telephone Encounter (Signed)
Med sent to pharm. Pt notified.  

## 2014-05-24 ENCOUNTER — Encounter: Payer: Self-pay | Admitting: Family Medicine

## 2014-05-24 ENCOUNTER — Ambulatory Visit (INDEPENDENT_AMBULATORY_CARE_PROVIDER_SITE_OTHER): Payer: BLUE CROSS/BLUE SHIELD | Admitting: Family Medicine

## 2014-05-24 VITALS — BP 150/100 | Ht 72.5 in | Wt 187.0 lb

## 2014-05-24 DIAGNOSIS — Z111 Encounter for screening for respiratory tuberculosis: Secondary | ICD-10-CM

## 2014-05-24 DIAGNOSIS — Z0000 Encounter for general adult medical examination without abnormal findings: Secondary | ICD-10-CM

## 2014-05-24 NOTE — Progress Notes (Signed)
   Subjective:    Patient ID: Aaron Powers, male    DOB: 11/04/1975, 39 y.o.   MRN: [id number]  HPI The patient comes in today for a wellness visit.  Sob wth exertion   A review of their health history was completed.  A review of medications was also completed.  Any needed refills; none- not taking any meds  Eating habits: health conscious  Falls/  MVA accidents in past few months: none  Regular exercise: yes  Specialist pt sees on regular basis: none  Preventative health issues were discussed.   Additional concerns: none  Exercise not the best, trying to run around   Does not smoke, not a lot of funme exposure  Hx of chol being in good control    Review of Systems  Constitutional: Negative for fever, activity change and appetite change.  HENT: Negative for congestion and rhinorrhea.   Eyes: Negative for discharge.  Respiratory: Negative for cough and wheezing.   Cardiovascular: Negative for chest pain.  Gastrointestinal: Negative for vomiting, abdominal pain and blood in stool.  Genitourinary: Negative for frequency and difficulty urinating.  Musculoskeletal: Negative for neck pain.  Skin: Negative for rash.  Allergic/Immunologic: Negative for environmental allergies and food allergies.  Neurological: Negative for weakness and headaches.  Psychiatric/Behavioral: Negative for agitation.  All other systems reviewed and are negative.      Objective:   Physical Exam  Constitutional: He appears well-developed and well-nourished.  HENT:  Head: Normocephalic and atraumatic.  Right Ear: External ear normal.  Left Ear: External ear normal.  Nose: Nose normal.  Mouth/Throat: Oropharynx is clear and moist.  Eyes: EOM are normal. Pupils are equal, round, and reactive to light.  Neck: Normal range of motion. Neck supple. No thyromegaly present.  Cardiovascular: Normal rate, regular rhythm and normal heart sounds.   No murmur heard. Pulmonary/Chest: Effort normal and  breath sounds normal. No respiratory distress. He has no wheezes.  Abdominal: Soft. Bowel sounds are normal. He exhibits no distension and no mass. There is no tenderness.  Genitourinary: Penis normal.  Musculoskeletal: Normal range of motion. He exhibits no edema.  Lymphadenopathy:    He has no cervical adenopathy.  Neurological: He is alert. He exhibits normal muscle tone.  Skin: Skin is warm and dry. No erythema.  Psychiatric: He has a normal mood and affect. His behavior is normal. Judgment normal.  Vitals reviewed.         Assessment & Plan:  Impression wellness exam plan exercise discussed. Diet discussed. Anticipatory guidance given. PPD applied. Form filled out. WSL

## 2014-05-27 LAB — TB SKIN TEST
Induration: 0 mm
TB Skin Test: NEGATIVE

## 2015-07-19 ENCOUNTER — Encounter: Payer: Self-pay | Admitting: Orthopedic Surgery

## 2015-07-19 ENCOUNTER — Ambulatory Visit (INDEPENDENT_AMBULATORY_CARE_PROVIDER_SITE_OTHER): Payer: 59

## 2015-07-19 ENCOUNTER — Ambulatory Visit (INDEPENDENT_AMBULATORY_CARE_PROVIDER_SITE_OTHER): Payer: 59 | Admitting: Orthopedic Surgery

## 2015-07-19 VITALS — BP 144/84 | Ht 72.0 in | Wt 203.0 lb

## 2015-07-19 DIAGNOSIS — M23322 Other meniscus derangements, posterior horn of medial meniscus, left knee: Secondary | ICD-10-CM

## 2015-07-19 DIAGNOSIS — M25562 Pain in left knee: Secondary | ICD-10-CM

## 2015-07-19 DIAGNOSIS — M2352 Chronic instability of knee, left knee: Secondary | ICD-10-CM | POA: Diagnosis not present

## 2015-07-19 DIAGNOSIS — Z9889 Other specified postprocedural states: Secondary | ICD-10-CM | POA: Diagnosis not present

## 2015-07-19 NOTE — Progress Notes (Signed)
Patient ID: Aaron Powers, male   DOB: 08/30/1975, 40 y.o.   MRN: [id number]  Chief Complaint  Patient presents with  . Knee Problem    LEFT KNEE PROBLEM, FEELS UNSTABLE, NO NEW INJURY    HPI Aaron Powers is a 40 y.o. male.  Presents for evaluation of ongoing symptoms in his left knee   HPI He had a left knee arthroscopy in October 2015 at that time we found him to have a scarred partial anterior cruciate ligament tear with scarring to the PCL he had a meniscal tear which we debrided and he did well up until about 3 or 4 months ago. He's not having pain is not having any constant swelling. He does have some intermittent swelling after long periods up on his feet. He works in the [employer].  His main symptom at this time is lack of confidence in his knee and he does notice some motion loss when his knee is swollen   Review of Systems Review of Systems Normal neuro  Denies fever   No past medical history reported  Past Surgical History  Procedure Laterality Date  . Knee arthroscopy with medial menisectomy Left 02/23/2014    Procedure: KNEE ARTHROSCOPY ;  Surgeon: Vickki HearingStanley E Arlene Brickel, MD;  Location: [facility];  Service: Orthopedics;  Laterality: Left;  medial and lateral menisectomy  . Menisectomy Left 02/23/2014    Procedure: Medial and Lateral MENISECTOMY;  Surgeon: Vickki HearingStanley E Marea Reasner, MD;  Location: [facility];  Service: Orthopedics;  Laterality: Left;    Family History  Problem Relation Age of Onset  . Healthy Mother   . Healthy Father     Social History Social History  Substance Use Topics  . Smoking status: Never Smoker   . Smokeless tobacco: None  . Alcohol Use: No    No Known Allergies  No current outpatient prescriptions on file.   No current facility-administered medications for this visit.    Physical Exam Blood pressure 144/84, height 6' (1.829 m), weight 203 lb (92.08 kg). Physical Exam The patient is well developed well nourished and well groomed.   Orientation to person place and time is normal  Mood is pleasant.  Ambulatory status normal gait  Right Knee Exam   Tenderness  The patient is experiencing no tenderness.     Range of Motion  The patient has normal right knee ROM.  Muscle Strength   The patient has normal right knee strength.  Tests  Lachman:  Anterior - trace     Drawer:       Anterior - trace     Pivot Shift: trace Patellar Apprehension: negative  Other  Erythema: absent Sensation: normal Pulse: present Swelling: none   Left Knee Exam   Tenderness  The patient is experiencing no tenderness.     Range of Motion  Extension: 5  Left knee flexion: 125.   Muscle Strength   The patient has normal left knee strength.  Tests  McMurray:  Medial - negative Lateral - negative Lachman:  Anterior - 1+    Posterior - negative Drawer:       Anterior - 2+     Posterior - negative Varus: negative Valgus: negative Pivot Shift: 1+ Patellar Apprehension: negative  Other  Erythema: absent Scars: present Sensation: normal Pulse: present Swelling: none      Data Reviewed IMAGING: AP/LAT and patellofemoral x-ray shows arthritis primarily lateral compartment with joint space narrowing sclerosis and bone spur but also there is some narrowing of  the medial joint space consistent with osteoarthritis   Assessment    The patient is lack of confidence in his knee with a definite anterior cruciate ligament insufficiency. At the time of his last surgery his knee was stable to pivot shift. It is no longer stable. Shift his symptoms include lack of confidence in the knee and we discussed possible treatment options he noticed that his knee was not feeling stable or that it may give out at any point if he stressed it even with a knee brace on  So at this point he has decided to proceed with anterior cruciate ligament reconstruction at his convenience  Plan    Surgical reconstruction left knee with  anterior cruciate ligament reconstruction

## 2015-07-20 ENCOUNTER — Telehealth: Payer: Self-pay | Admitting: Orthopedic Surgery

## 2015-07-20 NOTE — Telephone Encounter (Signed)
Patient called back to schedule surgery for left knee as discussed at office visit yesterday, 07/19/15.  States he is considering August 09, 2015 if available.  Please call at  [phone] [facility]

## 2015-07-25 NOTE — Telephone Encounter (Signed)
Called patient, no answer, left vm 

## 2015-07-26 NOTE — Telephone Encounter (Signed)
Spoke with patient and he is finalizing some things for surgery, and states he will call us back tomorrow to schedule. He is planning on  (March, 29,2017), but will let us know for sure when he calls back.

## 2015-07-27 NOTE — Telephone Encounter (Signed)
noted 

## 2015-07-27 NOTE — Telephone Encounter (Signed)
Patient called back today to relay that he needs to postpone his surgery, due to some changes with his wife's work, and his own schedule.  States he will call back later this spring to try to schedule a date.

## 2016-01-22 ENCOUNTER — Telehealth: Payer: Self-pay | Admitting: Orthopedic Surgery

## 2016-01-22 NOTE — Telephone Encounter (Signed)
Patient called to inquire about scheduling for knee re-construction surgery, possibly over the next 3 weeks if Dr Romeo AppleHarrison can do.  Relayed that patient he was last seen by Dr Romeo AppleHarrison 07/19/15; therefore, would need to return for office visit.  I have answered general questions regarding out of work notes and potential forms for out of work.  Please advise. Patient's home# is [phone]

## 2016-01-22 NOTE — Telephone Encounter (Signed)
ROUTING TO DR HARRISON TO ADVISE 

## 2016-01-29 ENCOUNTER — Telehealth: Payer: Self-pay | Admitting: Orthopedic Surgery

## 2016-01-29 NOTE — Telephone Encounter (Signed)
Please provide information I will need to proceed with this

## 2016-01-29 NOTE — Telephone Encounter (Addendum)
Mr. Aaron Powers called and stated he does want to proceed with the surgery on his left knee.  He stated that he spoke with Dr. Romeo AppleHarrison and that he could possibly have the surgery on October 5th.  Will you please take care of this for him and let him know of appointments both pre op and post op.  Thanks

## 2016-01-29 NOTE — Telephone Encounter (Signed)
Oct 5th looks filled  But call OR ask if I can have 2 rooms on OCT 5th  If not  I have the 12 th open or  The 6th if I cancel the office

## 2016-01-30 ENCOUNTER — Other Ambulatory Visit: Payer: Self-pay | Admitting: *Deleted

## 2016-01-30 NOTE — Telephone Encounter (Signed)
SURGERY SCHEDULED FOR OCT 5, CALLED PATIENT LEFT VM TO RETURN CALL

## 2016-01-31 NOTE — Telephone Encounter (Signed)
PATIENT GIVEN PRE OP AND POST OP APPTS

## 2016-02-07 ENCOUNTER — Encounter: Payer: Self-pay | Admitting: Orthopedic Surgery

## 2016-02-09 NOTE — Patient Instructions (Signed)
Quindarius Riese  02/09/2016     @PREFPERIOPPHARMACY @   Your procedure is scheduled on   02/15/2016  [facility]  [facility]  [facility].M.  Call this number if you have problems the morning of surgery:  [phone]   Remember:  Do not eat food or drink liquids after midnight.  Take these medicines the morning of surgery with A SIP OF WATER  none   Do not wear jewelry, make-up or nail polish.  Do not wear lotions, powders, or perfumes, or deoderant.  Do not shave 48 hours prior to surgery.  Men may shave face and neck.  Do not bring valuables to the hospital.  [facility] not [facility] any belongings or valuables.  Contacts, dentures or bridgework may not be worn into surgery.  Leave your suitcase in the car.  After surgery it may be brought to your room.  For patients admitted to the hospital, discharge time will be determined by your treatment team.  Patients discharged the day of surgery will not be allowed to drive home.   Name and phone number of your driver:   family Special instructions:  none  Please read over the following fact sheets that you were given. Anesthesia Post-op Instructions and Care and Recovery After Surgery       Anterior Cruciate Ligament Reconstruction Anterior cruciate ligament (ACL) reconstruction is a surgical procedure to replace a torn ACL. A ligament is a strong, fibrous, cordlike tissue that connects your bones. Your ACL guides your shin bone (tibia) as it moves in your knee joint. When your ACL is torn, your knee joint loses stability. During ACL reconstruction, a tissue from a ligament from another part of your body or from a donor (graft) is used to replace your torn ACL. LET [facility] KNOW ABOUT:   Any allergies you have.  Any medicine you are taking, including vitamins, herbs, eye drops, over-the-counter medicines, and creams.  Problems you have had with numbing medicines (local anesthetics) or  medicines that make you sleep (general anesthetics).  A family history of problems with anesthetics.  Any blood disorders you have or history of excessive bleeding you have.  Previous surgeries you have had.  Any history of tobacco use.  Any long-term health conditions you have, such as diabetes. RISKS AND COMPLICATIONS Generally, ACL reconstruction is a safe procedure. The following complications are very rare, but they can occur:  Infection.  Failure or repeated rupture of the reconstructed ligament.  Knee stiffness. BEFORE THE PROCEDURE  Your health care provider will instruct you when you need to stop eating and drinking.  Ask your health care provider if you need to change or stop taking any medicines that you regularly take. PROCEDURE  A small incision will be made in your knee. A thin, flexible, tube-like surgical instrument with a camera on the end (arthroscope) will be inserted into your knee joint. Your torn ACL will be removed with the arthroscope. The graft will be inserted into your knee joint with the arthroscope. Guides will be used to place the graft in the proper position. Tunnels in your upper tibia and femur will be created with large drills, which are placed over guidewires. The graft will be pulled into the tunnels on both your femur and your tibia. The graft will be secured in place in the tunnels with screws.  AFTER THE PROCEDURE You will be taken to the recovery area where  a nurse will watch and check your progress. You will be allowed to leave with a family member or other adult who will stay with you once your health care provider feels it is safe for you to go home.    This information is not intended to replace advice given to you by your health care provider. Make sure you discuss any questions you have with your health care provider.   Document Released: 02/27/2004 Document Revised: 05/20/2014 Document Reviewed: 09/16/2011 Elsevier Interactive Patient  Education 2016 Elsevier Inc.  Anterior Cruciate Ligament Reconstruction, Care After Refer to this sheet in the next few weeks. These instructions provide you with information on caring for yourself after your procedure. Your health care provider may also give you specific instructions. Your treatment has been planned according to current medical practices, but problems sometimes occur. Call your health care provider if you have any problems or questions after your procedure. HOME CARE INSTRUCTIONS  To ease pain and swelling, apply ice to your knee, twice per day, for 2-3 days after your procedure:  Put ice in a plastic bag.  Place a towel between your skin and the bag.  Leave the ice on for 15 minutes.  Change dressings as directed.  Take over-the-counter or prescription medicines for pain, discomfort, or fever only as directed by your health care provider.  Use crutches or braces or splints as directed by your health care provider.  Do not exercise your leg unless instructed to do so by your health care provider. SEEK IMMEDIATE MEDICAL CARE IF:   You develop increased redness, swelling, or pain around your incision sites.  You have increased pain with movement of your knee.  You have a marked increase in swelling around your knee.  You have a lot of pain in your leg when you move your foot up and down at your ankle.  There is pus or any unusual drainage coming from your incision sites.  You develop a fever.  You notice a bad smell coming from your incision sites.  Any of your incisions break open (edges do not stay together) after sutures or staples have been removed.   This information is not intended to replace advice given to you by your health care provider. Make sure you discuss any questions you have with your health care provider.   Document Released: 11/16/2004 Document Revised: 05/20/2014 Document Reviewed: 09/22/2011 Elsevier Interactive Patient Education 2016  Elsevier Inc[employer] PATIENT INSTRUCTIONS POST-ANESTHESIA  IMMEDIATELY FOLLOWING SURGERY:  Do not drive or operate machinery for the first twenty four hours after surgery.  Do not make any important decisions for twenty four hours after surgery or while taking narcotic pain medications or sedatives.  If you develop intractable nausea and vomiting or a severe headache please notify your doctor immediately.  FOLLOW-UP:  Please make an appointment with your surgeon as instructed. You do not need to follow up with anesthesia unless specifically instructed to do so.  WOUND CARE INSTRUCTIONS (if applicable):  Keep a dry clean dressing on the anesthesia/puncture wound site if there is drainage.  Once the wound has quit draining you may leave it open to air.  Generally you should leave the bandage intact for twenty four hours unless there is drainage.  If the epidural site drains for more than 36-48 hours please call the anesthesia department.  QUESTIONS?:  Please feel free to call your physician or the hospital operator if you have any questions, and they will be happy to assist you.

## 2016-02-12 ENCOUNTER — Encounter: Payer: Self-pay | Admitting: *Deleted

## 2016-02-12 ENCOUNTER — Encounter (HOSPITAL_COMMUNITY): Payer: Self-pay

## 2016-02-12 ENCOUNTER — Encounter (HOSPITAL_COMMUNITY)
Admission: RE | Admit: 2016-02-12 | Discharge: 2016-02-12 | Disposition: A | Discharge status: Home / Self Care | Payer: 59 | Source: Ambulatory Visit | Attending: Orthopedic Surgery | Admitting: Orthopedic Surgery

## 2016-02-12 DIAGNOSIS — M2352 Chronic instability of knee, left knee: Secondary | ICD-10-CM | POA: Diagnosis present

## 2016-02-12 DIAGNOSIS — M238X2 Other internal derangements of left knee: Secondary | ICD-10-CM | POA: Diagnosis not present

## 2016-02-12 HISTORY — DX: Unspecified osteoarthritis, unspecified site: M19.90

## 2016-02-12 LAB — CBC WITH DIFFERENTIAL/PLATELET
Basophils Absolute: 0 10*3/uL (ref 0.0–0.1)
Basophils Relative: 0 %
EOS PCT: 1 %
Eosinophils Absolute: 0 10*3/uL (ref 0.0–0.7)
HCT: 41.8 % (ref 39.0–52.0)
Hemoglobin: 14.7 g/dL (ref 13.0–17.0)
LYMPHS ABS: 2.1 10*3/uL (ref 0.7–4.0)
LYMPHS PCT: 30 %
MCH: 29.7 pg (ref 26.0–34.0)
MCHC: 35.2 g/dL (ref 30.0–36.0)
MCV: 84.4 fL (ref 78.0–100.0)
MONO ABS: 0.5 10*3/uL (ref 0.1–1.0)
MONOS PCT: 7 %
Neutro Abs: 4.4 10*3/uL (ref 1.7–7.7)
Neutrophils Relative %: 62 %
PLATELETS: 239 10*3/uL (ref 150–400)
RBC: 4.95 MIL/uL (ref 4.22–5.81)
RDW: 12.3 % (ref 11.5–15.5)
WBC: 7.1 10*3/uL (ref 4.0–10.5)

## 2016-02-12 LAB — BASIC METABOLIC PANEL
Anion gap: 5 (ref 5–15)
BUN: 20 mg/dL (ref 6–20)
CO2: 27 mmol/L (ref 22–32)
Calcium: 9.3 mg/dL (ref 8.9–10.3)
Chloride: 104 mmol/L (ref 101–111)
Creatinine, Ser: 1.04 mg/dL (ref 0.61–1.24)
GFR calc Af Amer: >60 mL/min (ref >60)
GFR calc non Af Amer: >60 mL/min (ref >60)
GLUCOSE: 92 mg/dL (ref 65–99)
POTASSIUM: 4 mmol/L (ref 3.5–5.1)
Sodium: 136 mmol/L (ref 135–145)

## 2016-02-12 NOTE — Pre-Procedure Instructions (Signed)
Patient given information to sign up for my chart at home. 

## 2016-02-12 NOTE — Progress Notes (Unsigned)
[employer] authorized surgery for 02/15/16 auth # [id number]

## 2016-02-14 NOTE — H&P (Signed)
Chief Complaint  Patient presents with  . Knee Problem      LEFT KNEE PROBLEM, FEELS UNSTABLE, NO NEW INJURY      HPI Aaron MelenaRaleigh Maxon is a 40 y.o. male.  Presents for evaluation of ongoing symptoms in his left knee     HPI He had a left knee arthroscopy in October 2015 at that time we found him to have a scarred partial anterior cruciate ligament tear with scarring to the PCL he had a meniscal tear which we debrided and he did well up until about 3 or 4 months ago. He's not having pain is not having any constant swelling. He does have some intermittent swelling after long periods up on his feet. He works in the [employer].   His main symptom at this time is lack of confidence in his knee and he does notice some motion loss when his knee is swollen     Review of Systems Review of Systems Normal neuro  Denies fever     No past medical history reported         Past Surgical History  Procedure Laterality Date  . Knee arthroscopy with medial menisectomy Left 02/23/2014      Procedure: KNEE ARTHROSCOPY ;  Surgeon: Vickki HearingStanley E Gia Lusher, MD;  Location: [facility];  Service: Orthopedics;  Laterality: Left;  medial and lateral menisectomy  . Menisectomy Left 02/23/2014      Procedure: Medial and Lateral MENISECTOMY;  Surgeon: Vickki HearingStanley E Spencer Cardinal, MD;  Location: [facility];  Service: Orthopedics;  Laterality: Left;           Family History  Problem Relation Age of Onset  . Healthy Mother    . Healthy Father        Social History     Social History  Substance Use Topics  . Smoking status: Never Smoker   . Smokeless tobacco: None  . Alcohol Use: No      No Known Allergies   No current outpatient prescriptions on file.    No current facility-administered medications for this visit.      Physical Exam Blood pressure 144/84, height 6' (1.829 m), weight 203 lb (92.08 kg). Physical Exam The patient is well developed well nourished and well groomed.  Orientation to person place  and time is normal  Mood is pleasant.   Ambulatory status normal gait   Right Knee Exam    Tenderness  The patient is experiencing no tenderness.        Range of Motion  The patient has normal right knee ROM.   Muscle Strength    The patient has normal right knee strength.   Tests  Lachman:  Anterior - trace     Drawer:       Anterior - trace     Pivot Shift: trace Patellar Apprehension: negative   Other  Erythema: absent Sensation: normal Pulse: present Swelling: none     Left Knee Exam    Tenderness  The patient is experiencing no tenderness.        Range of Motion  Extension: 5  Left knee flexion: 125.    Muscle Strength    The patient has normal left knee strength.   Tests  McMurray:  Medial - negative Lateral - negative Lachman:  Anterior - 1+    Posterior - negative Drawer:       Anterior - 2+     Posterior - negative Varus: negative Valgus: negative Pivot Shift: 1+ Patellar Apprehension:  negative   Other  Erythema: absent Scars: present Sensation: normal Pulse: present Swelling: none         Data Reviewed IMAGING: [facility]/LAT and patellofemoral x-ray shows arthritis primarily lateral compartment with joint space narrowing sclerosis and bone spur but also there is some narrowing of the medial joint space consistent with osteoarthritis     Assessment    The patient is lack of confidence in his knee with a definite anterior cruciate ligament insufficiency. At the time of his last surgery his knee was stable to pivot shift. It is no longer stable. Shift his symptoms include lack of confidence in the knee and we discussed possible treatment options he noticed that his knee was not feeling stable or that it may give out at any point if he stressed it even with a knee brace on   So at this point he has decided to proceed with anterior cruciate ligament reconstruction at his convenience   Plan    Surgical reconstruction left knee with anterior  cruciate ligament reconstruction  02/14/2016 12:33 PM Fuller Canada, MD

## 2016-02-15 ENCOUNTER — Ambulatory Visit (HOSPITAL_COMMUNITY)
Admission: RE | Admit: 2016-02-15 | Discharge: 2016-02-15 | Disposition: A | Discharge status: Home / Self Care | Payer: 59 | Source: Ambulatory Visit | Attending: Orthopedic Surgery | Admitting: Orthopedic Surgery

## 2016-02-15 ENCOUNTER — Ambulatory Visit (HOSPITAL_COMMUNITY): Payer: 59 | Admitting: Anesthesiology

## 2016-02-15 ENCOUNTER — Encounter (HOSPITAL_COMMUNITY): Payer: Self-pay

## 2016-02-15 ENCOUNTER — Encounter (HOSPITAL_COMMUNITY)
Admission: RE | Disposition: A | Discharge status: Home / Self Care | Payer: Self-pay | Source: Ambulatory Visit | Attending: Orthopedic Surgery

## 2016-02-15 DIAGNOSIS — M2352 Chronic instability of knee, left knee: Secondary | ICD-10-CM | POA: Insufficient documentation

## 2016-02-15 DIAGNOSIS — S83511D Sprain of anterior cruciate ligament of right knee, subsequent encounter: Secondary | ICD-10-CM

## 2016-02-15 DIAGNOSIS — M238X2 Other internal derangements of left knee: Secondary | ICD-10-CM | POA: Insufficient documentation

## 2016-02-15 HISTORY — PX: ANTERIOR CRUCIATE LIGAMENT REPAIR: SHX115

## 2016-02-15 SURGERY — RECONSTRUCTION, KNEE, ACL
Anesthesia: General | Site: Knee | Laterality: Left

## 2016-02-15 MED ORDER — ONDANSETRON HCL 4 MG/2ML IJ SOLN
INTRAMUSCULAR | Status: AC
  Filled 2016-02-15: qty 2

## 2016-02-15 MED ORDER — PROPOFOL 10 MG/ML IV BOLUS
INTRAVENOUS | Status: AC
  Filled 2016-02-15: qty 20

## 2016-02-15 MED ORDER — ROCURONIUM BROMIDE 100 MG/10ML IV SOLN
INTRAVENOUS | Status: DC | PRN
  Administered 2016-02-15: 5 mg via INTRAVENOUS
  Administered 2016-02-15: 35 mg via INTRAVENOUS
  Administered 2016-02-15: 10 mg via INTRAVENOUS

## 2016-02-15 MED ORDER — NEOSTIGMINE METHYLSULFATE 10 MG/10ML IV SOLN
INTRAVENOUS | Status: DC | PRN
  Administered 2016-02-15: 2 mg via INTRAVENOUS

## 2016-02-15 MED ORDER — HYDROCODONE-ACETAMINOPHEN 7.5-325 MG PO TABS: 1.0000 | ORAL_TABLET | ORAL | 0 refills | Status: DC | PRN

## 2016-02-15 MED ORDER — PROPOFOL 10 MG/ML IV BOLUS
INTRAVENOUS | Status: DC | PRN
  Administered 2016-02-15: 20 mg via INTRAVENOUS
  Administered 2016-02-15: 150 mg via INTRAVENOUS

## 2016-02-15 MED ORDER — FENTANYL CITRATE (PF) 100 MCG/2ML IJ SOLN
INTRAMUSCULAR | Status: DC | PRN
  Administered 2016-02-15 (×2): 50 ug via INTRAVENOUS
  Administered 2016-02-15 (×2): 100 ug via INTRAVENOUS
  Administered 2016-02-15 (×2): 50 ug via INTRAVENOUS

## 2016-02-15 MED ORDER — PROMETHAZINE HCL 12.5 MG PO TABS: 12.5000 mg | ORAL_TABLET | Freq: Four times a day (QID) | ORAL | 0 refills | Status: DC | PRN

## 2016-02-15 MED ORDER — HYDROCODONE-ACETAMINOPHEN 7.5-325 MG PO TABS
1.0000 | ORAL_TABLET | Freq: Once | ORAL | Status: AC
  Administered 2016-02-15: 1 via ORAL
  Filled 2016-02-15: qty 1

## 2016-02-15 MED ORDER — FENTANYL CITRATE (PF) 100 MCG/2ML IJ SOLN
INTRAMUSCULAR | Status: AC
  Filled 2016-02-15: qty 2

## 2016-02-15 MED ORDER — LIDOCAINE HCL (PF) 1 % IJ SOLN
INTRAMUSCULAR | Status: AC
  Filled 2016-02-15: qty 5

## 2016-02-15 MED ORDER — GLYCOPYRROLATE 0.2 MG/ML IJ SOLN
INTRAMUSCULAR | Status: AC
  Filled 2016-02-15: qty 2

## 2016-02-15 MED ORDER — DEXAMETHASONE SODIUM PHOSPHATE 4 MG/ML IJ SOLN
4.0000 mg | Freq: Once | INTRAMUSCULAR | Status: AC
  Administered 2016-02-15: 4 mg via INTRAVENOUS

## 2016-02-15 MED ORDER — GLYCOPYRROLATE 0.2 MG/ML IJ SOLN
INTRAMUSCULAR | Status: DC | PRN
  Administered 2016-02-15: 0.4 mg via INTRAVENOUS

## 2016-02-15 MED ORDER — MIDAZOLAM HCL 5 MG/5ML IJ SOLN
INTRAMUSCULAR | Status: DC | PRN
  Administered 2016-02-15: 2 mg via INTRAVENOUS

## 2016-02-15 MED ORDER — CHLORHEXIDINE GLUCONATE 4 % EX LIQD: 60.0000 mL | Freq: Once | CUTANEOUS | Status: DC

## 2016-02-15 MED ORDER — HYDROMORPHONE HCL 1 MG/ML IJ SOLN: 0.2500 mg | INTRAMUSCULAR | Status: DC | PRN

## 2016-02-15 MED ORDER — PROMETHAZINE HCL 25 MG/ML IJ SOLN
6.2500 mg | INTRAMUSCULAR | Status: AC | PRN
  Administered 2016-02-15 (×2): 6.25 mg via INTRAVENOUS

## 2016-02-15 MED ORDER — IBUPROFEN 800 MG PO TABS: 800.0000 mg | ORAL_TABLET | Freq: Three times a day (TID) | ORAL | 1 refills | Status: DC | PRN

## 2016-02-15 MED ORDER — ONDANSETRON HCL 4 MG/2ML IJ SOLN
4.0000 mg | Freq: Once | INTRAMUSCULAR | Status: AC
Start: 2016-02-15 — End: 2016-02-15
  Administered 2016-02-15: 4 mg via INTRAVENOUS

## 2016-02-15 MED ORDER — PROMETHAZINE HCL 25 MG/ML IJ SOLN
INTRAMUSCULAR | Status: AC
  Filled 2016-02-15: qty 1

## 2016-02-15 MED ORDER — EPINEPHRINE HCL 1 MG/ML IJ SOLN
INTRAMUSCULAR | Status: AC
  Filled 2016-02-15: qty 10

## 2016-02-15 MED ORDER — LIDOCAINE HCL 1 % IJ SOLN
INTRAMUSCULAR | Status: DC | PRN
  Administered 2016-02-15: 30 mg via INTRADERMAL

## 2016-02-15 MED ORDER — BUPIVACAINE-EPINEPHRINE (PF) 0.5% -1:200000 IJ SOLN
INTRAMUSCULAR | Status: AC
  Filled 2016-02-15: qty 60

## 2016-02-15 MED ORDER — FENTANYL CITRATE (PF) 250 MCG/5ML IJ SOLN
INTRAMUSCULAR | Status: AC
  Filled 2016-02-15: qty 5

## 2016-02-15 MED ORDER — MIDAZOLAM HCL 2 MG/2ML IJ SOLN
INTRAMUSCULAR | Status: AC
  Filled 2016-02-15: qty 2

## 2016-02-15 MED ORDER — ACETAMINOPHEN 500 MG PO TABS
500.0000 mg | ORAL_TABLET | Freq: Once | ORAL | Status: AC
Start: 2016-02-15 — End: 2016-02-15
  Administered 2016-02-15: 500 mg via ORAL
  Filled 2016-02-15: qty 1

## 2016-02-15 MED ORDER — CEFAZOLIN SODIUM-DEXTROSE 2-4 GM/100ML-% IV SOLN
2.0000 g | INTRAVENOUS | Status: AC
  Administered 2016-02-15: 2 g via INTRAVENOUS
  Filled 2016-02-15: qty 100

## 2016-02-15 MED ORDER — ONDANSETRON HCL 4 MG/2ML IJ SOLN
4.0000 mg | Freq: Once | INTRAMUSCULAR | Status: AC
  Administered 2016-02-15: 4 mg via INTRAVENOUS
  Filled 2016-02-15: qty 2

## 2016-02-15 MED ORDER — SODIUM CHLORIDE 0.9 % IR SOLN
Status: DC | PRN
  Administered 2016-02-15 (×8): 3000 mL

## 2016-02-15 MED ORDER — BUPIVACAINE-EPINEPHRINE 0.5% -1:200000 IJ SOLN
INTRAMUSCULAR | Status: DC | PRN
  Administered 2016-02-15: 60 mL

## 2016-02-15 MED ORDER — MIDAZOLAM HCL 2 MG/2ML IJ SOLN
1.0000 mg | INTRAMUSCULAR | Status: DC | PRN
  Administered 2016-02-15: 2 mg via INTRAVENOUS

## 2016-02-15 MED ORDER — DEXAMETHASONE SODIUM PHOSPHATE 4 MG/ML IJ SOLN
INTRAMUSCULAR | Status: AC
  Filled 2016-02-15: qty 1

## 2016-02-15 MED ORDER — KETOROLAC TROMETHAMINE 30 MG/ML IJ SOLN
30.0000 mg | Freq: Once | INTRAMUSCULAR | Status: AC
  Administered 2016-02-15: 30 mg via INTRAVENOUS
  Filled 2016-02-15: qty 1

## 2016-02-15 MED ORDER — ROCURONIUM BROMIDE 50 MG/5ML IV SOLN
INTRAVENOUS | Status: AC
Start: 2016-02-15 — End: 2016-02-15
  Filled 2016-02-15: qty 1

## 2016-02-15 MED ORDER — SODIUM CHLORIDE 0.9 % IR SOLN
Status: DC | PRN
  Administered 2016-02-15: 1000 mL

## 2016-02-15 MED ORDER — IBUPROFEN 800 MG PO TABS
800.0000 mg | ORAL_TABLET | Freq: Once | ORAL | Status: AC
Start: 2016-02-15 — End: 2016-02-15
  Administered 2016-02-15: 800 mg via ORAL
  Filled 2016-02-15: qty 1

## 2016-02-15 MED ORDER — LACTATED RINGERS IV SOLN
INTRAVENOUS | Status: DC
  Administered 2016-02-15: 09:00:00 via INTRAVENOUS

## 2016-02-15 SURGICAL SUPPLY — 90 items
ANCHOR BUTTON TIGHTROPE BTB (Anchor) ×2 IMPLANT
BANDAGE ELASTIC 6 LF NS (GAUZE/BANDAGES/DRESSINGS) ×2 IMPLANT
BANDAGE ESMARK 6X9 LF (GAUZE/BANDAGES/DRESSINGS) ×1 IMPLANT
BIT DRILL 2.0MX128MM (BIT) ×3 IMPLANT
BIT DRILL 2.4X128 (BIT) ×1 IMPLANT
BIT DRILL 2.4X128MM (BIT) ×1
BIT DRILL 2.8X128 (BIT) ×1 IMPLANT
BIT DRILL 2.8X128MM (BIT)
BLADE AGGRESSIVE PLUS 4.0 (BLADE) ×3 IMPLANT
BLADE OSC/SAGITTAL MD 9X18.5 (BLADE) ×3 IMPLANT
BLADE SURG 15 STRL LF DISP TIS (BLADE) ×1 IMPLANT
BLADE SURG 15 STRL SS (BLADE) ×3
BLADE SURG SZ10 CARB STEEL (BLADE) ×3 IMPLANT
BNDG CMPR 9X6 STRL LF SNTH (GAUZE/BANDAGES/DRESSINGS) ×1
BNDG CMPR MED 5X6 ELC HKLP NS (GAUZE/BANDAGES/DRESSINGS) ×1
BNDG ESMARK 6X9 LF (GAUZE/BANDAGES/DRESSINGS) ×3
BRACE T-SCOPE KNEE POSTOP (MISCELLANEOUS) ×3 IMPLANT
BUR 5.0 BARRELL (BURR) IMPLANT
BUR AGGRESSIVE PLUS 5.0 (BURR) ×3 IMPLANT
BUR BARRELL 4.0 (BURR) ×2 IMPLANT
BUR ROUND 5.0 (BURR) IMPLANT
BioComposite Interference Screw 10 x 23 mm ×2 IMPLANT
CHLORAPREP W/TINT 26ML (MISCELLANEOUS) ×6 IMPLANT
CLOTH BEACON ORANGE TIMEOUT ST (SAFETY) ×3 IMPLANT
COVER LIGHT HANDLE STERIS (MISCELLANEOUS) ×6 IMPLANT
CUFF CRYO COOLER WITH MOTOR (SOFTGOODS) IMPLANT
CUFF CRYO KNEE LG 20X31 COOLER (ORTHOPEDIC SUPPLIES) ×1 IMPLANT
CUFF CRYO KNEE18X23 MED (MISCELLANEOUS) ×1 IMPLANT
CUFF TOURNIQUET SINGLE 34IN LL (TOURNIQUET CUFF) ×3 IMPLANT
CUTTER TOMCAT 5.0MM (BURR) ×2 IMPLANT
DECANTER SPIKE VIAL GLASS SM (MISCELLANEOUS) ×6 IMPLANT
DRILL FLIPCUTTER II 10MM (CUTTER) IMPLANT
ELECT REM PT RETURN 9FT ADLT (ELECTROSURGICAL) ×3
ELECTRODE REM PT RTRN 9FT ADLT (ELECTROSURGICAL) ×1 IMPLANT
FIBERSTICK 2 (SUTURE) ×3 IMPLANT
FLIPCUTTER II 10MM (CUTTER) ×3
FLOOR PAD 36X40 (MISCELLANEOUS) ×3
GAUZE SPONGE 4X4 12PLY STRL (GAUZE/BANDAGES/DRESSINGS) ×2 IMPLANT
GAUZE SPONGE 4X4 16PLY XRAY LF (GAUZE/BANDAGES/DRESSINGS) ×3 IMPLANT
GAUZE XEROFORM 5X9 LF (GAUZE/BANDAGES/DRESSINGS) ×3 IMPLANT
GLOVE BIOGEL PI IND STRL 7.0 (GLOVE) ×1 IMPLANT
GLOVE BIOGEL PI INDICATOR 7.0 (GLOVE) ×2
GLOVE SKINSENSE NS SZ8.0 LF (GLOVE) ×2
GLOVE SKINSENSE STRL SZ8.0 LF (GLOVE) ×1 IMPLANT
GLOVE SS N UNI LF 8.5 STRL (GLOVE) ×3 IMPLANT
GOWN STRL REUS W/TWL LRG LVL3 (GOWN DISPOSABLE) ×12 IMPLANT
GOWN STRL REUS W/TWL XL LVL3 (GOWN DISPOSABLE) ×3 IMPLANT
HLDR LEG FOAM (MISCELLANEOUS) ×1 IMPLANT
INST SET MINOR BONE (KITS) ×3 IMPLANT
IV NS IRRIG 3000ML ARTHROMATIC (IV SOLUTION) ×24 IMPLANT
KIT BLADEGUARD II DBL (SET/KITS/TRAYS/PACK) ×3 IMPLANT
KIT ROOM TURNOVER AP CYSTO (KITS) ×3 IMPLANT
KIT TRANSTIBIAL (DISPOSABLE) ×3 IMPLANT
LEG HOLDER FOAM (MISCELLANEOUS) ×2
MANIFOLD NEPTUNE II (INSTRUMENTS) ×3 IMPLANT
MARKER SKIN DUAL TIP RULER LAB (MISCELLANEOUS) ×5 IMPLANT
NDL HYPO 21X1.5 SAFETY (NEEDLE) ×1 IMPLANT
NDL SPNL 18GX3.5 QUINCKE PK (NEEDLE) ×1 IMPLANT
NEEDLE HYPO 21X1.5 SAFETY (NEEDLE) ×3 IMPLANT
NEEDLE SPNL 18GX3.5 QUINCKE PK (NEEDLE) ×3 IMPLANT
NS IRRIG 1000ML POUR BTL (IV SOLUTION) ×3 IMPLANT
PACK ARTHRO LIMB DRAPE STRL (MISCELLANEOUS) ×3 IMPLANT
PACK BASIC III (CUSTOM PROCEDURE TRAY) ×3
PACK SRG BSC III STRL LF ECLPS (CUSTOM PROCEDURE TRAY) ×1 IMPLANT
PAD ABD 5X9 TENDERSORB (GAUZE/BANDAGES/DRESSINGS) ×2 IMPLANT
PAD ARMBOARD 7.5X6 YLW CONV (MISCELLANEOUS) ×3 IMPLANT
PAD FLOOR 36X40 (MISCELLANEOUS) ×1 IMPLANT
PADDING CAST COTTON 6X4 STRL (CAST SUPPLIES) IMPLANT
PENCIL HANDSWITCHING (ELECTRODE) ×3 IMPLANT
SET ARTHROSCOPY INST (INSTRUMENTS) ×3 IMPLANT
SET ARTHROSCOPY PUMP TUBE (IRRIGATION / IRRIGATOR) ×3 IMPLANT
SET BASIN LINEN APH (SET/KITS/TRAYS/PACK) ×3 IMPLANT
SPONGE LAP 18X18 X RAY DECT (DISPOSABLE) ×3 IMPLANT
STAPLER VISISTAT 35W (STAPLE) ×2 IMPLANT
SUT BRALON NAB BRD #1 30IN (SUTURE) ×3 IMPLANT
SUT ETHIBOND 5 LR DA (SUTURE) ×6 IMPLANT
SUT ETHIBOND NAB OS 4 #2 30IN (SUTURE) IMPLANT
SUT ETHILON 3 0 FSL (SUTURE) ×3 IMPLANT
SUT MON AB 0 CT1 (SUTURE) ×3 IMPLANT
SUT MON AB 2-0 CT1 36 (SUTURE) ×3 IMPLANT
SUT PROLENE 3 0 PS 2 (SUTURE) IMPLANT
SUT VIC AB 1 CT1 27 (SUTURE) ×3
SUT VIC AB 1 CT1 27XBRD ANTBC (SUTURE) ×1 IMPLANT
SYR 30ML LL (SYRINGE) ×3 IMPLANT
SYR BULB IRRIGATION 50ML (SYRINGE) ×6 IMPLANT
TOWEL OR 17X26 4PK STRL BLUE (TOWEL DISPOSABLE) ×3 IMPLANT
WAND 50 DEG COVAC W/CORD (SURGICAL WAND) IMPLANT
WAND 90 DEG TURBOVAC W/CORD (SURGICAL WAND) ×2 IMPLANT
YANKAUER SUCT 12FT TUBE ARGYLE (SUCTIONS) ×3 IMPLANT
YANKAUER SUCT BULB TIP 10FT TU (MISCELLANEOUS) ×9 IMPLANT

## 2016-02-15 NOTE — Interval H&P Note (Signed)
History and Physical Interval Note:  02/15/2016 9:25 AM Surgical site clean  BP 116/76   Pulse (!) 57   Temp 97.6 F (36.4 C) (Oral)   Resp (!) 27   Ht 6' (1.829 m)   Wt 190 lb (86.2 kg)   SpO2 94%   BMI 25.77 kg/m   [facility]  has presented today for surgery, with the diagnosis of left acl disruption  The various methods of treatment have been discussed with the patient and family. After consideration of risks, benefits and other options for treatment, the patient has consented to  Procedure(s): RECONSTRUCTION ANTERIOR CRUCIATE LIGAMENT (ACL) (Left) as a surgical intervention .  The patient's history has been reviewed, patient examined, no change in status, stable for surgery.  I have reviewed the patient's chart and labs.  Questions were answered to the patient's satisfaction.     Fuller CanadaStanley Harrison

## 2016-02-15 NOTE — Transfer of Care (Signed)
Immediate Anesthesia Transfer of Care Note  Patient: Aaron Powers  Procedure(s) Performed: Procedure(s): RECONSTRUCTION LEFT ANTERIOR CRUCIATE LIGAMENT WITH AUTOGRAFT (Left)  Patient Location: PACU  Anesthesia Type:General  Level of Consciousness: sedated  Airway & Oxygen Therapy: Patient Spontanous Breathing and Patient connected to face mask oxygen  Post-op Assessment: Report given to RN and Post -op Vital signs reviewed and stable  Post vital signs: Reviewed and stable  Last Vitals:  Vitals:   02/15/16 0950 02/15/16 0955  BP: 122/79   Pulse:    Resp: 14 18  Temp:      Last Pain:  Vitals:   02/15/16 0854  TempSrc: Oral      Patients Stated Pain Goal: 3 (02/15/16 0854)  Complications: No apparent anesthesia complications

## 2016-02-15 NOTE — Anesthesia Preprocedure Evaluation (Signed)
Anesthesia Evaluation  Patient identified by MRN, date of birth, ID band Patient awake    Reviewed: Allergy & Precautions, H&P , NPO status , Patient's Chart, lab work & pertinent test results  Airway Mallampati: I  TM Distance: >3 FB Neck ROM: Full    Dental  (+) Teeth Intact   Pulmonary neg pulmonary ROS,    breath sounds clear to auscultation       Cardiovascular negative cardio ROS   Rhythm:Regular Rate:Normal     Neuro/Psych    GI/Hepatic negative GI ROS,   Endo/Other    Renal/GU      Musculoskeletal   Abdominal   Peds  Hematology   Anesthesia Other Findings   Reproductive/Obstetrics                             Anesthesia Physical Anesthesia Plan  ASA: I  Anesthesia Plan: General   Post-op Pain Management:    Induction: Intravenous  Airway Management Planned: Oral ETT  Additional Equipment:   Intra-op Plan:   Post-operative Plan: Extubation in OR  Informed Consent: I have reviewed the patients History and Physical, chart, labs and discussed the procedure including the risks, benefits and alternatives for the proposed anesthesia with the patient or authorized representative who has indicated his/her understanding and acceptance.     Plan Discussed with:   Anesthesia Plan Comments:         Anesthesia Quick Evaluation

## 2016-02-15 NOTE — Anesthesia Procedure Notes (Signed)
Procedure Name: Intubation Date/Time: 02/15/2016 10:09 AM Performed by: Despina HiddenIDACAVAGE, [name] Pre-anesthesia Checklist: Patient identified, Patient being monitored, Timeout performed, Emergency Drugs available and Suction available Patient Re-evaluated:Patient Re-evaluated prior to inductionOxygen Delivery Method: Circle System Utilized Preoxygenation: Pre-oxygenation with 100% oxygen Intubation Type: IV induction Ventilation: Mask ventilation without difficulty Laryngoscope Size: Mac and 3 Grade View: Grade I Tube type: Oral Tube size: 8.0 mm Number of attempts: 1 Airway Equipment and Method: stylet Placement Confirmation: ETT inserted through vocal cords under direct vision,  positive ETCO2 and breath sounds checked- equal and bilateral Secured at: 22 cm Tube secured with: Tape Dental Injury: Teeth and Oropharynx as per pre-operative assessment

## 2016-02-15 NOTE — Brief Op Note (Addendum)
02/15/2016  12:54 PM  PATIENT:  Aaron Powers  39 y.o. male  PRE-OPERATIVE DIAGNOSIS:  left acl disruption  POST-OPERATIVE DIAGNOSIS:  Left acl disruption  PROCEDURE:  Procedure(s): RECONSTRUCTION ANTERIOR CRUCIATE LIGAMENT (ACL) (Left)  SURGEON:  Surgeon(s) and Role:    * Corky Blumstein E Pansey Pinheiro, MD - Primary  PHYSICIAN ASSISTANT:    EBL:  Total I/O In: 700 [I.V.:700] Out: 25 [Blood:25]   Assisted by Betty Ashley  Anesthesia Gen. anesthesia  No blood administered  No drains   Dragon Dictation  Plan of care home after PACU  Disposition home after PACU  Stable   Local medications Marcaine with epinephrine 60 mL  No specimens  Counts were correct   TOURNIQUET:   Total Tourniquet Time Documented: Thigh (Left) - 121 minutes Total: Thigh (Left) - 121 minutes    Delay start of Pharmacological VTE agent (>24hrs) due to surgical blood loss or risk of bleeding: not applicable.   Details of procedure the patient was identified as Aaron Powers in the preop area. His surgical site was confirmed and marked as left knee. Chart review was completed. Patient was taken to surgery for general anesthesia.  He had an exam under anesthesia which revealed a grade 3 Lockman 2+ pivot shift a crunching grinding sensation on pivot maneuver  Full range of motion  Mild grade 1 laxity of the MCL  After sterile prep with ChloraPrep and then sterile draping. Timeout was completed  The limb was then exsanguinated with a six-inch Esmarch the tourniquet was elevated to 300 mmHg  A midline incision was made just off the tubercle of the tibia and extended to the inferior pole of patella subcutaneous tissue was then divided. The patella tendon was exposed. It measured 34 mm. We took the central 10 mm and using a saw we took a bone block from the tibial tubercle and a bone block from the patella.  He went to the back table prepare the graft using a tight rope. The graft was placed on the  tensioning board.  We then turned our attention to the joint. We placed a lateral portal and a medial portal we did a diagnostic arthroscopy. The prior meniscectomy was stable. A chondral lesion grade 2 which looked as though it had filled in with some scarred cartilage over the medial femoral condyle. The lateral plateau had some chondral changes  The lateral meniscus appeared to have an old tear but no acute displaced tear. The tear was in the region of the popliteus hiatus.  The anterior cruciate ligament was scarred to the PCL but was completely laxatives a Drive suicide in the lateral wall  He also had a bone spur on the lateral tibial spine as well as a lateral femoral condyle  We removed the bone spurs with a bur  Remove the anterior cruciate ligament. We identified the lateral wall medial surface an old anterior cruciate ligament footprint  We used a medial portal and an accessory medial portal to define this area to establish the anterior cruciate ligament footprint  I used an awl for the medial accessory portal to mark the anterior cruciate ligament footprint in the center of the 2 bundles posterior lateral and anteromedial.  Through the lateral portal we placed a anterior cruciate ligament guide from the Arthrex set we set it at 105 and passed a foot cutter through a separate lateral incision through the joint  This gave an excellent tunnel of 10 by 25 mm. We passed a fiber stick through   the femoral tunnel.  We then placed the scope in the lateral portal and then prepared the tibial tunnel and the anterior cruciate ligament footprint we used a 52-1/2 angle on the tibial guide and passed a guidewire and an 11 mm reamer.. We of rest all tunnels clear all soft tissue debris and then passed the graft using the tight rope. This gave an excellent fit in the tunnel. We placed the knee in extension there was no impingement  We placed the knee in full extension and placed a 10 x 23  interference screw. We got an excellent Lockman full range of motion  We irrigated the wounds close the patellar tendon defect with #1 Bralon we bone grafted the patella with bone from the bone blocks  The close the subcutaneous with 0 Monocryl and both lateral incision and the tibial incision. We close the 3 portals with 3-0 nylon  We injected the knee with 60 mL of Marcaine and epinephrine  We placed a sterile dressing and a Cryo/Cuff and a brace locked in extension  Postop plan full weightbearing as tolerated in a brace for 6 weeks  Start physical therapy in one week when swelling and inflammation have subsided.  

## 2016-02-15 NOTE — Anesthesia Postprocedure Evaluation (Signed)
Anesthesia Post Note  Patient: Kodah Przybysz  Procedure(s) Performed: Procedure(s) (LRB): RECONSTRUCTION LEFT ANTERIOR CRUCIATE LIGAMENT WITH AUTOGRAFT (Left)  Patient location during evaluation: PACU Anesthesia Type: General Level of consciousness: awake, oriented and patient cooperative Pain management: pain level controlled Vital Signs Assessment: post-procedure vital signs reviewed and stable Respiratory status: spontaneous breathing, nonlabored ventilation and respiratory function stable Cardiovascular status: blood pressure returned to baseline and stable Postop Assessment: no signs of nausea or vomiting Anesthetic complications: no    Last Vitals:  Vitals:   02/15/16 1315 02/15/16 1330  BP: 119/83 135/82  Pulse: (!) 53 71  Resp: (!) 9 15  Temp:      Last Pain:  Vitals:   02/15/16 1303  TempSrc:   PainSc: Asleep                 Nelton Amsden J

## 2016-02-15 NOTE — Op Note (Signed)
02/15/2016  12:54 PM  PATIENT:  Aaron Powers  40 y.o. male  PRE-OPERATIVE DIAGNOSIS:  left acl disruption  POST-OPERATIVE DIAGNOSIS:  Left acl disruption  PROCEDURE:  Procedure(s): RECONSTRUCTION ANTERIOR CRUCIATE LIGAMENT (ACL) (Left)  SURGEON:  Surgeon(s) and Role:    * Vickki Hearing, MD - Primary  PHYSICIAN ASSISTANT:    EBL:  Total I/O In: 700 [I.V.:700] Out: 25 [Blood:25]   Assisted by Norville Nation  Anesthesia Gen. 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   Details of procedure the patient was identified as Aaron Powers in the preop area. His surgical site was confirmed and marked as left knee. Chart review was completed. Patient was taken to surgery for general anesthesia.  He had an exam under anesthesia which revealed a grade 3 Lockman 2+ pivot shift a crunching grinding sensation on pivot maneuver  Full range of motion  Mild grade 1 laxity of the MCL  After sterile prep with ChloraPrep and then sterile draping. Timeout was completed  The limb was then exsanguinated with a six-inch Esmarch the tourniquet was elevated to 300 mmHg  A midline incision was made just off the tubercle of the tibia and extended to the inferior pole of patella subcutaneous tissue was then divided. The patella tendon was exposed. It measured 34 mm. We took the central 10 mm and using a saw we took a bone block from the tibial tubercle and a bone block from the patella.  He went to the back table prepare the graft using a tight rope. The graft was placed on the  tensioning board.  We then turned our attention to the joint. We placed a lateral portal and a medial portal we did a diagnostic arthroscopy. The prior meniscectomy was stable. A chondral lesion grade 2 which looked as though it had filled in with some scarred cartilage over the medial femoral condyle. The lateral plateau had some chondral changes  The lateral meniscus appeared to have an old tear but no acute displaced tear. The tear was in the region of the popliteus hiatus.  The anterior cruciate ligament was scarred to the PCL but was completely laxatives a Drive suicide in the lateral wall  He also had a bone spur on the lateral tibial spine as well as a lateral femoral condyle  We removed the bone spurs with a bur  Remove the anterior cruciate ligament. We identified the lateral wall medial surface an old anterior cruciate ligament footprint  We used a medial portal and an accessory medial portal to define this area to establish the anterior cruciate ligament footprint  I used an awl for the medial accessory portal to mark the anterior cruciate ligament footprint in the center of the 2 bundles posterior lateral and anteromedial.  Through the lateral portal we placed a anterior cruciate ligament guide from the Arthrex set we set it at 105 and passed a foot cutter through a separate lateral incision through the joint  This gave an excellent tunnel of 10 by 25 mm. We passed a fiber stick through  the femoral tunnel.  We then placed the scope in the lateral portal and then prepared the tibial tunnel and the anterior cruciate ligament footprint we used a 52-1/2 angle on the tibial guide and passed a guidewire and an 11 mm reamer.. We of rest all tunnels clear all soft tissue debris and then passed the graft using the tight rope. This gave an excellent fit in the tunnel. We placed the knee in extension there was no impingement  We placed the knee in full extension and placed a 10 x 23  interference screw. We got an excellent Lockman full range of motion  We irrigated the wounds close the patellar tendon defect with #1 Bralon we bone grafted the patella with bone from the bone blocks  The close the subcutaneous with 0 Monocryl and both lateral incision and the tibial incision. We close the 3 portals with 3-0 nylon  We injected the knee with 60 mL of Marcaine and epinephrine  We placed a sterile dressing and a Cryo/Cuff and a brace locked in extension  Postop plan full weightbearing as tolerated in a brace for 6 weeks  Start physical therapy in one week when swelling and inflammation have subsided.

## 2016-02-20 ENCOUNTER — Encounter: Payer: Self-pay | Admitting: Orthopedic Surgery

## 2016-02-20 ENCOUNTER — Ambulatory Visit (INDEPENDENT_AMBULATORY_CARE_PROVIDER_SITE_OTHER): Payer: 59 | Admitting: Orthopedic Surgery

## 2016-02-20 VITALS — BP 156/84 | HR 87 | Wt 203.0 lb

## 2016-02-20 DIAGNOSIS — Z9889 Other specified postprocedural states: Secondary | ICD-10-CM

## 2016-02-20 DIAGNOSIS — Z4889 Encounter for other specified surgical aftercare: Secondary | ICD-10-CM

## 2016-02-20 NOTE — Progress Notes (Signed)
Patient ID: [name], male   DOB: 02/28/1976, 40 y.o.   MRN: [id number]  Post op visit   Chief Complaint  Patient presents with  . Follow-up    POST OP 1, LT ACL, DOS 02/15/16    BP (!) 156/84   Pulse 87   Wt 203 lb (92.1 kg)   BMI 27.53 kg/m   Isolated bone patellar tendon bone reconstruction left anterior cruciate ligament first postop visit postop day 5  Patient says the hydrocodone 7.5 made him sick and jittery  He stopped taking it. He is complaining of a moderate pain. He went ibuprofen  He is ambulatory with his brace crutches  His incision looks good does have some ecchymosis in the calf area and tibial area. No sign of compartment syndrome with normal compartment To Palpation and No Pain on Passive Stretch  He Can Start Physical Therapy Advance As Tolerated. Continue Weightbearing As Tolerated.  We Put Him on Tylenol No. 3 and Ibuprofen  Follow-Up for Staples to Come out in One Week

## 2016-02-21 MED ORDER — ACETAMINOPHEN-CODEINE #3 300-30 MG PO TABS: 1.0000 | ORAL_TABLET | ORAL | 0 refills | Status: DC | PRN

## 2016-02-23 ENCOUNTER — Ambulatory Visit: Payer: 59 | Admitting: Orthopedic Surgery

## 2016-02-26 ENCOUNTER — Ambulatory Visit (HOSPITAL_COMMUNITY): Payer: 59

## 2016-02-28 ENCOUNTER — Ambulatory Visit (INDEPENDENT_AMBULATORY_CARE_PROVIDER_SITE_OTHER): Payer: 59 | Admitting: Orthopedic Surgery

## 2016-02-28 DIAGNOSIS — Z9889 Other specified postprocedural states: Secondary | ICD-10-CM

## 2016-02-28 DIAGNOSIS — Z4889 Encounter for other specified surgical aftercare: Secondary | ICD-10-CM

## 2016-02-28 NOTE — Patient Instructions (Signed)
Home exercises out of brace  Continue brace when walking

## 2016-02-28 NOTE — Progress Notes (Signed)
Patient ID: Aaron Powers, male   DOB: 10/07/1975, 40 y.o.   MRN: [id number]  Post op visit   Status post anterior cruciate ligament reconstruction left knee October 5    Staples were taken out and wound looked clean the ecchymosis cleaned up well his brace 0-90. He can get a PT appointment coinciding with his schedule until the 27th so I gave him a pet pad to start exercises and follow-up with me in 4 weeks

## 2016-03-07 ENCOUNTER — Encounter (HOSPITAL_COMMUNITY): Payer: Self-pay | Admitting: Physical Therapy

## 2016-03-07 ENCOUNTER — Ambulatory Visit (HOSPITAL_COMMUNITY): Payer: 59 | Attending: Family Medicine | Admitting: Physical Therapy

## 2016-03-07 DIAGNOSIS — R600 Localized edema: Secondary | ICD-10-CM | POA: Diagnosis present

## 2016-03-07 DIAGNOSIS — M25562 Pain in left knee: Secondary | ICD-10-CM | POA: Insufficient documentation

## 2016-03-07 DIAGNOSIS — R2681 Unsteadiness on feet: Secondary | ICD-10-CM | POA: Diagnosis present

## 2016-03-07 DIAGNOSIS — M25662 Stiffness of left knee, not elsewhere classified: Secondary | ICD-10-CM | POA: Diagnosis present

## 2016-03-08 ENCOUNTER — Ambulatory Visit (HOSPITAL_COMMUNITY): Payer: 59 | Admitting: Physical Therapy
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
## 2016-03-11 ENCOUNTER — Ambulatory Visit (HOSPITAL_COMMUNITY): Payer: 59 | Admitting: Physical Therapy

## 2016-03-11 DIAGNOSIS — R2681 Unsteadiness on feet: Secondary | ICD-10-CM

## 2016-03-11 DIAGNOSIS — M25662 Stiffness of left knee, not elsewhere classified: Secondary | ICD-10-CM

## 2016-03-11 DIAGNOSIS — M25562 Pain in left knee: Secondary | ICD-10-CM

## 2016-03-11 DIAGNOSIS — R600 Localized edema: Secondary | ICD-10-CM

## 2016-03-11 NOTE — Therapy (Signed)
[facility] [address] [place], [place], [address] Phone: [phone]   Fax:  [phone]  Physical Therapy Treatment  Patient Details  Name: Aaron MelenaRaleigh Powers MRN: [id number] Date of Birth: 06/02/1975 Referring Provider: Fuller CanadaStanley Harrison, MD  Encounter Date: 03/11/2016      PT End of Session - 03/11/16 1816    Visit Number 2   Number of Visits 12   Date for PT Re-Evaluation 03/29/16   Authorization Type UHC   Authorization Time Period 03/07/16 to 04/18/16   PT Start Time 1732   PT Stop Time 1812   PT Time Calculation (min) 40 min   Activity Tolerance Patient tolerated treatment well;No increased pain   Behavior During Therapy WFL for tasks assessed/performed      Past Medical History:  Diagnosis Date  . Arthritis     Past Surgical History:  Procedure Laterality Date  . ANTERIOR CRUCIATE LIGAMENT REPAIR Left 02/15/2016   Procedure: RECONSTRUCTION LEFT ANTERIOR CRUCIATE LIGAMENT WITH AUTOGRAFT;  Surgeon: Vickki HearingStanley E Harrison, MD;  Location: [facility];  Service: Orthopedics;  Laterality: Left;  . KNEE ARTHROSCOPY WITH MEDIAL MENISECTOMY Left 02/23/2014   Procedure: KNEE ARTHROSCOPY ;  Surgeon: Vickki HearingStanley E Harrison, MD;  Location: [facility];  Service: Orthopedics;  Laterality: Left;  medial and lateral menisectomy  . MENISECTOMY Left 02/23/2014   Procedure: Medial and Lateral MENISECTOMY;  Surgeon: Vickki HearingStanley E Harrison, MD;  Location: [facility];  Service: Orthopedics;  Laterality: Left;    There were no vitals filed for this visit.                       [facility] Adult PT Treatment/Exercise - 03/11/16 0001      Knee/Hip Exercises: Machines for Strengthening   Total Gym Leg Press L34, BLE squat at 45 deg, x20 reps; Single leg squat x15 each, 45 deg      Knee/Hip Exercises: Standing   Heel Raises Both;2 sets;15 reps   Heel Raises Limitations single leg    Knee Flexion Left;2 sets;15 reps   Knee Flexion Limitations 5#   Forward  Step Up Left;2 sets;15 reps;Hand Hold: 0;Step Height: 4"   Forward Step Up Limitations Rt knee drive    Step Down Left;2 sets;15 reps;Hand Hold: 0;Step Height: 4"   Step Down Limitations focus on prevention of hip drop/knee valgus    Rebounder blue ball x30 reps, mini squat on foam; LLE mini squat hold on firm surface x30 tosses      Knee/Hip Exercises: Supine   Straight Leg Raises Left;1 set   Straight Leg Raises Limitations 25 reps with slight ER, no extensor lag noted    Other Supine Knee/Hip Exercises straight leg bridge on physioball x20; single straight leg bridge 2x10 reps      Knee/Hip Exercises: Prone   Hamstring Curl 1 set;20 reps   Hamstring Curl Limitations green TB                PT Education - 03/11/16 1814    Education provided Yes   Education Details updated HEP; technique with therex   Person(s) Educated Patient   Methods Explanation;Handout   Comprehension Verbalized understanding;Returned demonstration          PT Short Term Goals - 03/08/16 0838      PT SHORT TERM GOAL #1   Title pt will demo consistency and independence with HEP to improve knee ROM and strength    Time 2   Period Weeks  Status New     PT SHORT TERM GOAL #2   Title Pt will verbalize proper ice parameters to improve consistency with ice/elevation to decrease Lt knee swelling.    Time 2   Period Weeks   Status New     PT SHORT TERM GOAL #3   Title pt will demo proper sit to stand technique throughout his session without cues from therpist, to improve LLE strength and overall body mechanics.    Time 2   Period Weeks   Status New     PT SHORT TERM GOAL #4   Title Pt will demo improved Lt knee extension to atleast 0 degress, to improve gait mechanics.    Time 3   Period Weeks   Status New           PT Long Term Goals - 03/08/16 [id number]      PT LONG TERM GOAL #1   Title pt will report he is able to resume daily activity around the house without pain.    Time 6    Period Weeks   Status New     PT LONG TERM GOAL #2   Title Pt will perform single leg squat on each LE without noted knee valgus deviation/shaking, 3/5 trials, to indicate improved knee stability and proprioception.    Time 6   Period Weeks   Status New     PT LONG TERM GOAL #3   Title pt will perform SLS on foam surface up to 20 sec without increased knee shaking to indicate improved proprioception on unsteady surfaces.    Time 6   Period Weeks   Status New     PT LONG TERM GOAL #4   Title pt will demo improved knee strength to 5/5 MMT with knee extension/flexion testing, to improve safety with functional tasks.    Time 6   Period Weeks   Status New               Plan - 03/11/16 1817    Clinical Impression Statement Pt arrived without report of pain or symptoms this session. Progressed all exercises within protocol and pt able to perform without report of pain. Noting knee valgus deviation with step downs secondary to hip instability which was improved some with verbal cues. Updated HEP to reflect improvements in LE strength and balance with pt verbalizing and demonstrating understanding.   Rehab Potential Good   PT Frequency 2x / week   PT Duration 6 weeks   PT Treatment/Interventions ADLs/Self Care Home Management;Cryotherapy;Electrical Stimulation;Aquatic Therapy;Therapeutic activities;Functional mobility training;Stair training;Gait training;Therapeutic exercise;Balance training;Neuromuscular re-education;Patient/family education;Manual techniques;Orthotic Fit/Training;Scar mobilization;Passive range of motion   PT Next Visit Plan Review HEP, update if able to perform with ease, begin closed chain strengthening (step downs, etc.) at small heights with focus on knee valgus control, scar mobility    PT Home Exercise Plan SLS, single leg heel raise, SLR with slight ER x25 reps, step ups, straight leg bridge    Recommended Other Services none    Consulted and Agree with Plan  of Care Patient      Patient will benefit from skilled therapeutic intervention in order to improve the following deficits and impairments:  Abnormal gait, Decreased activity tolerance, Decreased balance, Decreased endurance, Decreased skin integrity, Decreased scar mobility, Decreased range of motion, Decreased strength, Increased fascial restricitons, Postural dysfunction, Pain, Improper body mechanics, Increased edema  Visit Diagnosis: Left knee pain, unspecified chronicity  Stiffness of left knee, not elsewhere classified  Localized edema  Unsteadiness on feet     Problem List Patient Active Problem List   Diagnosis Date Noted  . Anterior cruciate ligament disruption, right, subsequent encounter   . S/P left knee arthroscopy 02/28/2014  . 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 Meniscus, lateral, derangement 02/18/2014  . Medial meniscus, posterior horn derangement 02/18/2014  . ACL laxity 02/10/2014  . Torn medial meniscus 02/10/2014  . Left knee pain 02/10/2014    6:22 PM,03/11/16 Marylyn Ishihara PT, DPT [facility] [phone]  [facility] [address] [place], [place], [address] Phone: [phone]   Fax:  [phone]  Name: Aaron Powers MRN: [id number] Date of Birth: 1975/06/12

## 2016-03-12 ENCOUNTER — Ambulatory Visit (HOSPITAL_COMMUNITY): Payer: 59 | Admitting: Physical Therapy

## 2016-03-20 ENCOUNTER — Ambulatory Visit (HOSPITAL_COMMUNITY): Payer: 59

## 2016-03-20 ENCOUNTER — Telehealth (HOSPITAL_COMMUNITY): Payer: Self-pay | Admitting: Family Medicine

## 2016-03-20 NOTE — Telephone Encounter (Signed)
03/20/16 pt cx for today and Friday he said he couldn't come but he gave no reason

## 2016-03-22 ENCOUNTER — Encounter (HOSPITAL_COMMUNITY): Payer: 59

## 2016-03-25 ENCOUNTER — Ambulatory Visit (HOSPITAL_COMMUNITY): Payer: 59 | Attending: Family Medicine | Admitting: Physical Therapy

## 2016-03-25 DIAGNOSIS — R600 Localized edema: Secondary | ICD-10-CM | POA: Diagnosis present

## 2016-03-25 DIAGNOSIS — M25662 Stiffness of left knee, not elsewhere classified: Secondary | ICD-10-CM | POA: Diagnosis present

## 2016-03-25 DIAGNOSIS — R2681 Unsteadiness on feet: Secondary | ICD-10-CM | POA: Insufficient documentation

## 2016-03-25 DIAGNOSIS — M25562 Pain in left knee: Secondary | ICD-10-CM | POA: Insufficient documentation

## 2016-03-25 NOTE — Therapy (Addendum)
[place], [place], [address] Phone: [phone]   Fax:  [phone]  Physical Therapy Treatment/Discharge  Patient Details  Name: Aaron Powers MRN: [id number] Date of Birth: May 08, 1976 Referring Provider: Arther Abbott, MD  Encounter Date: 03/25/2016      PT End of Session - 03/25/16 1816    Visit Number 3   Number of Visits 12   Date for PT Re-Evaluation 03/29/16   Authorization Type [employer]   Authorization Time Period 03/07/16 to 04/18/16   PT Start Time 0076   PT Stop Time 1810   PT Time Calculation (min) 40 min   Activity Tolerance Patient tolerated treatment well;No increased pain   Behavior During Therapy WFL for tasks assessed/performed      Past Medical History:  Diagnosis Date  . Arthritis     Past Surgical History:  Procedure Laterality Date  . ANTERIOR CRUCIATE LIGAMENT REPAIR Left 02/15/2016   Procedure: RECONSTRUCTION LEFT ANTERIOR CRUCIATE LIGAMENT WITH AUTOGRAFT;  Surgeon: Carole Civil, MD;  Location: [facility];  Service: Orthopedics;  Laterality: Left;  . KNEE ARTHROSCOPY WITH MEDIAL MENISECTOMY Left 02/23/2014   Procedure: KNEE ARTHROSCOPY ;  Surgeon: Carole Civil, MD;  Location: [facility];  Service: Orthopedics;  Laterality: Left;  medial and lateral menisectomy  . MENISECTOMY Left 02/23/2014   Procedure: Medial and Lateral MENISECTOMY;  Surgeon: Carole Civil, MD;  Location: [facility];  Service: Orthopedics;  Laterality: Left;    There were no vitals filed for this visit.      Subjective Assessment - 03/25/16 1737    Subjective Pt reports things are going well. He has no issues at this time other than minor aching and stiffness earlier this week with what he believes is related to the weather.    Pertinent History Lt meniscectomy 2015   Limitations --  squatting    How long can you sit comfortably? unlimited    How long can you stand comfortably? unlimited    How long can  you walk comfortably? unlimited    Patient Stated Goals get back to work hopefully by 04/11/16   Currently in Pain? No/denies                         [facility] Adult PT Treatment/Exercise - 03/25/16 0001      Exercises   Exercises Other Exercises   Other Exercises  single leg squat 2x15 reps on LLE, x15 reps with RLE, using blue band to decrease knee valgus/corckscrew deviation     Knee/Hip Exercises: Machines for Strengthening   Cybex Knee Flexion LLE only 2x10 reps with level 4 weight      Knee/Hip Exercises: Standing   SLS with Vectors on foam pad 4x10 sec hold ant/lateral/post directions; LLE only    Other Standing Knee Exercises RNT closed chain DF 10x10 sec each    Other Standing Knee Exercises sidestepping with blue TB around ankles x4 RT;      Knee/Hip Exercises: Seated   Long Arc [employer];Other (comment)  x1 minute each, 5# increased to 10# in 90-60 deg range                 PT Education - 03/25/16 1815    Education provided Yes   Education Details discussed progress; updates to HEP to address improvements in strength and proprioception; adjusted schedule to allow pt to continue with activity at home to improve transition to independence  at home ; technique with therex   Person(s) Educated Patient   Methods Explanation;Demonstration;Verbal cues   Comprehension Verbalized understanding;Returned demonstration          PT Short Term Goals - 03/08/16 7858      PT SHORT TERM GOAL #1   Title pt will demo consistency and independence with HEP to improve knee ROM and strength    Time 2   Period Weeks   Status New     PT SHORT TERM GOAL #2   Title Pt will verbalize proper ice parameters to improve consistency with ice/elevation to decrease Lt knee swelling.    Time 2   Period Weeks   Status New     PT SHORT TERM GOAL #3   Title pt will demo proper sit to stand technique throughout his session without cues from therpist, to improve LLE  strength and overall body mechanics.    Time 2   Period Weeks   Status New     PT SHORT TERM GOAL #4   Title Pt will demo improved Lt knee extension to atleast 0 degress, to improve gait mechanics.    Time 3   Period Weeks   Status New           PT Long Term Goals - 03/08/16 8502      PT LONG TERM GOAL #1   Title pt will report he is able to resume daily activity around the house without pain.    Time 6   Period Weeks   Status New     PT LONG TERM GOAL #2   Title Pt will perform single leg squat on each LE without noted knee valgus deviation/shaking, 3/5 trials, to indicate improved knee stability and proprioception.    Time 6   Period Weeks   Status New     PT LONG TERM GOAL #3   Title pt will perform SLS on foam surface up to 20 sec without increased knee shaking to indicate improved proprioception on unsteady surfaces.    Time 6   Period Weeks   Status New     PT LONG TERM GOAL #4   Title pt will demo improved knee strength to 5/5 MMT with knee extension/flexion testing, to improve safety with functional tasks.    Time 6   Period Weeks   Status New               Plan - 03/25/16 1817    Clinical Impression Statement Pt continues to progress towards all goals and is consistently performing his HEP at home without issues. Session continued with progressions of therex and neuromuscular re-education within protocol limits. Pt able to perform exercises without report of pain, noting some fatigue and muscle with balance activities. At this time, he is consistently performing his HEP at home and I am canceling his next 2 appointments to allow him to continue with updates made this visit. This will allow him to transition to more independence with HEP maintenance at home once he is cleared to return to work by the referring MD.   Rehab Potential Good   PT Frequency 2x / week   PT Duration 6 weeks   PT Treatment/Interventions ADLs/Self Care Home  Management;Cryotherapy;Electrical Stimulation;Aquatic Therapy;Therapeutic activities;Functional mobility training;Stair training;Gait training;Therapeutic exercise;Balance training;Neuromuscular re-education;Patient/family education;Manual techniques;Orthotic Fit/Training;Scar mobilization;Passive range of motion   PT Next Visit Plan Review HEP, update if able to perform with ease, begin closed chain strengthening (step downs, etc.) at small heights with  focus on knee valgus control, scar mobility    PT Home Exercise Plan SLS with vectors on pillow, single leg heel raise, SLR with slight ER x25 reps, step ups, straight leg bridge    Recommended Other Services none    Consulted and Agree with Plan of Care Patient      Patient will benefit from skilled therapeutic intervention in order to improve the following deficits and impairments:  Abnormal gait, Decreased activity tolerance, Decreased balance, Decreased endurance, Decreased skin integrity, Decreased scar mobility, Decreased range of motion, Decreased strength, Increased fascial restricitons, Postural dysfunction, Pain, Improper body mechanics, Increased edema  Visit Diagnosis: Left knee pain, unspecified chronicity  Stiffness of left knee, not elsewhere classified  Localized edema  Unsteadiness on feet     Problem List Patient Active Problem List   Diagnosis Date Noted  . Anterior cruciate ligament disruption, right, subsequent encounter   . S/P left knee arthroscopy 02/28/2014  . 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 Meniscus, lateral, derangement 02/18/2014  . Medial meniscus, posterior horn derangement 02/18/2014  . ACL laxity 02/10/2014  . Torn medial meniscus 02/10/2014  . Left knee pain 02/10/2014   6:21 PM,03/25/16 Elly Modena PT, DPT [facility] [place] [address] [place], [place], [address] Phone:  [phone]   Fax:  [phone]  Name: Aaron Powers MRN: [id number] Date of Birth: 07-21-1975     *Addendum to d/c pt and close episode of care  PHYSICAL THERAPY DISCHARGE SUMMARY  Visits from Start of Care: 3  Current functional level related to goals / functional outcomes: See most recent treatment note for pt performance. Spoke with pt on the phone today, and he is requesting to be discharged from PT secondary to being cleared by MD and high copays for treatment. He feels comfortable continuing with his most updated HEP independently at home.    Remaining deficits: See most recent treatment note for pt deficits. Pt demonstrating decreased knee proprioception and stability during single activity, this will hopefully improve with HEP adherence despite his request to discharge from PT.   Education / Equipment: discussed progress; updates to HEP to address improvements in strength and proprioception; adjusted schedule to allow pt to continue with activity at home to improve transition to independence at home ; technique with therex Plan: Patient agrees to discharge.  Patient goals were not met. Patient is being discharged due to the patient's request.  ?????    4:31 PM,04/18/16 Elly Modena PT, [facility] [phone]

## 2016-03-27 ENCOUNTER — Encounter: Payer: Self-pay | Admitting: Orthopedic Surgery

## 2016-03-27 ENCOUNTER — Ambulatory Visit (INDEPENDENT_AMBULATORY_CARE_PROVIDER_SITE_OTHER): Payer: 59 | Admitting: Orthopedic Surgery

## 2016-03-27 DIAGNOSIS — Z4889 Encounter for other specified surgical aftercare: Secondary | ICD-10-CM

## 2016-03-27 DIAGNOSIS — Z9889 Other specified postprocedural states: Secondary | ICD-10-CM

## 2016-03-27 NOTE — Progress Notes (Signed)
Patient ID: Dwana MelenaRaleigh Schlatter, male   DOB: 08/11/1975, 40 y.o.   MRN: [id number]  Post op visit   Chief Complaint  Patient presents with  . Follow-up    left acl repair, DOS 02/15/16   This is 6 weeks postop from anatomic anterior cruciate ligament reconstruction left knee. The patient says things are going slower than he would like  He has a slight flexion contracture. His knee flexion is 130 versus 135 on the opposite side. The Lachman test is normal.  Has a small effusion  He would like to go back to work in 3 weeks  We will fit him for anterior cruciate ligament brace to wear back to work  Follow-up 3 weeks  I advised him to work on extension including prone hangs and quad sets

## 2016-03-28 ENCOUNTER — Encounter (HOSPITAL_COMMUNITY): Payer: 59

## 2016-04-01 ENCOUNTER — Encounter (HOSPITAL_COMMUNITY): Payer: 59 | Admitting: Physical Therapy

## 2016-04-08 ENCOUNTER — Ambulatory Visit (HOSPITAL_COMMUNITY): Payer: 59 | Admitting: Physical Therapy

## 2016-04-08 ENCOUNTER — Telehealth (HOSPITAL_COMMUNITY): Payer: Self-pay | Admitting: Physical Therapy

## 2016-04-08 NOTE — Telephone Encounter (Signed)
No Show: Pt states he thought he was not scheduled today. He requested his next appointment be canceled and to leave his appointment on 12/7 due to his progress so far and hoping to being cleared to go back to work by the MD.  6:23 PM,04/08/16 Marylyn IshiharaSara Kiser PT, DPT [facility] [phone]

## 2016-04-11 ENCOUNTER — Encounter (HOSPITAL_COMMUNITY): Payer: 59 | Admitting: Physical Therapy

## 2016-04-15 ENCOUNTER — Encounter (HOSPITAL_COMMUNITY): Payer: 59 | Admitting: Physical Therapy

## 2016-04-17 ENCOUNTER — Encounter: Payer: Self-pay | Admitting: Orthopedic Surgery

## 2016-04-17 ENCOUNTER — Ambulatory Visit (INDEPENDENT_AMBULATORY_CARE_PROVIDER_SITE_OTHER): Payer: 59 | Admitting: Orthopedic Surgery

## 2016-04-17 DIAGNOSIS — Z4889 Encounter for other specified surgical aftercare: Secondary | ICD-10-CM

## 2016-04-17 DIAGNOSIS — Z9889 Other specified postprocedural states: Secondary | ICD-10-CM

## 2016-04-17 NOTE — Progress Notes (Signed)
Patient ID: Aaron Powers, male   DOB: 08/04/1975, 40 y.o.   MRN: [id number]  Post op visit   Chief Complaint  Patient presents with  . Follow-up    LEFT ACL, DOS 02/15/16    Left anterior cruciate ligament reconstruction   Place of occasional swelling if he is up on the knee for 4-5 hours relieved by ibuprofen and rest  Lachman test is normal he's regain full range of motion no effusion noted in the knee today  Patient awaiting brace from [employer] and then he can return to work on 26th of December with a follow-up here mid January.

## 2016-04-18 ENCOUNTER — Ambulatory Visit (HOSPITAL_COMMUNITY): Payer: 59 | Attending: Family Medicine | Admitting: Physical Therapy

## 2016-04-18 ENCOUNTER — Telehealth (HOSPITAL_COMMUNITY): Payer: Self-pay | Admitting: Physical Therapy

## 2016-04-18 NOTE — Telephone Encounter (Signed)
No show: pt states he had his appointment with Dr. Romeo AppleHarrison yesterday and was cleared to return to work. He states he forgot to call and cancel today's appointment.   4:26 PM,04/18/16 Marylyn IshiharaSara Kiser PT, DPT [facility] [phone]

## 2016-05-01 ENCOUNTER — Encounter: Payer: Self-pay | Admitting: Orthopedic Surgery

## 2016-05-01 ENCOUNTER — Telehealth: Payer: Self-pay | Admitting: Orthopedic Surgery

## 2016-05-01 NOTE — Telephone Encounter (Signed)
Approved.  

## 2016-05-01 NOTE — Telephone Encounter (Signed)
Patient was fitted for the [employer] brace today, per brace representative Randall HissMatthew Craven.  Per last office visit chart note, patient wishes to be released to work; form in [employer] box for signing; work note has been done accordingly.

## 2016-05-21 ENCOUNTER — Ambulatory Visit: Payer: 59 | Admitting: Orthopedic Surgery

## 2016-05-21 ENCOUNTER — Encounter: Payer: Self-pay | Admitting: Orthopedic Surgery

## 2016-07-24 ENCOUNTER — Other Ambulatory Visit: Payer: Self-pay | Admitting: Family Medicine

## 2016-07-24 MED ORDER — OSELTAMIVIR PHOSPHATE 75 MG PO CAPS: 75.0000 mg | ORAL_CAPSULE | Freq: Two times a day (BID) | ORAL | 0 refills | Status: DC

## 2016-08-19 IMAGING — MR MR KNEE*L* W/O CM
4 of 6 series · 13 of 40 positions shown · non-contrast
Comparison: Three views of left knee 02/10/2014.

CLINICAL DATA: Anterior left knee pain since a twisting injury 1
week ago. Initial encounter.

EXAM:
MRI OF THE LEFT KNEE WITHOUT CONTRAST
TECHNIQUE: Multiplanar, multisequence MR imaging of the knee was performed. No
intravenous contrast was administered.

[Series 5: pdfs axial · axial · 3.0mm · 0.22mm/px · z∈[-41,+31]mm · 3 of 30 slices shown]
[im 5/30]
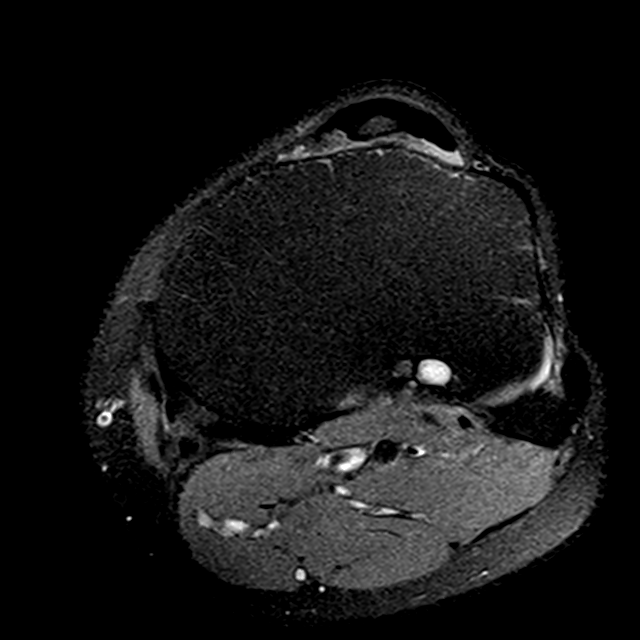
[im 17/30]
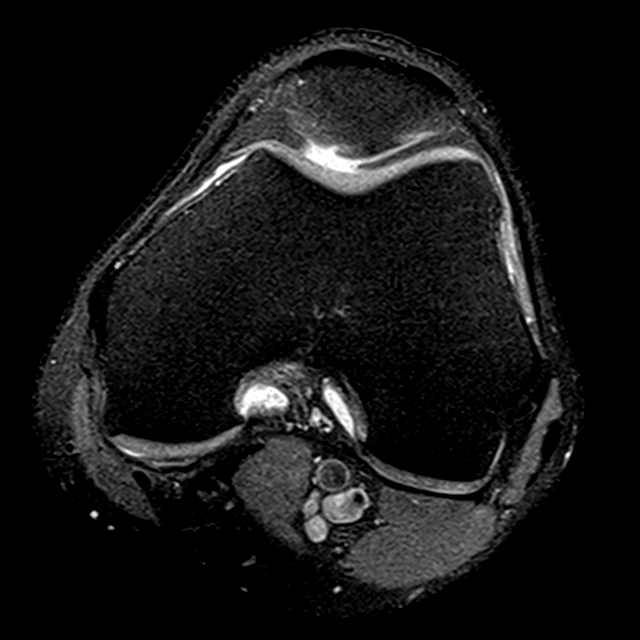
[im 25/30]
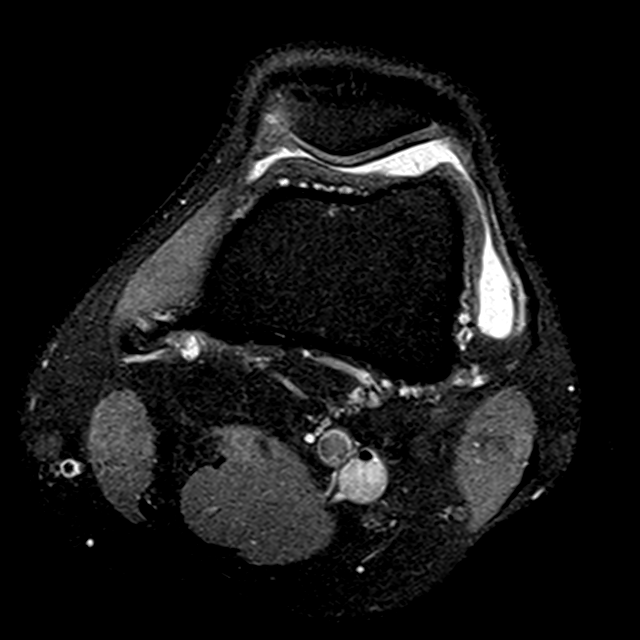

[Series 6: T1 · coronal · 3.0mm · 0.23mm/px · 4 of 26 slices shown]
[im 1/26]
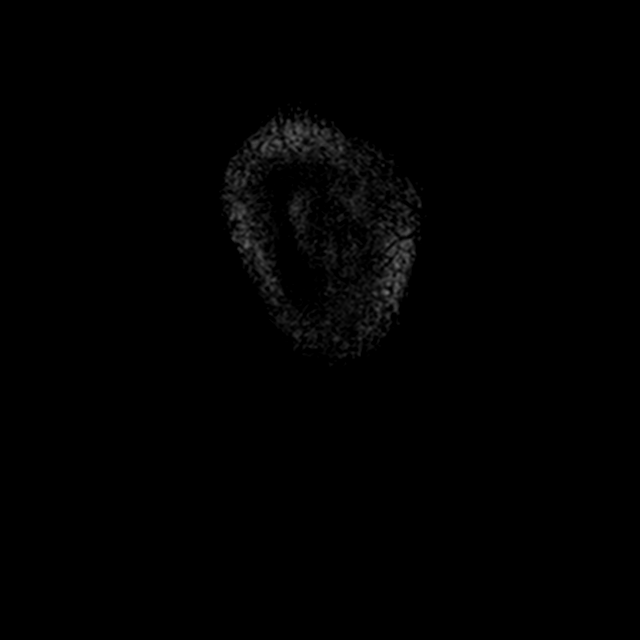
[im 5/26]
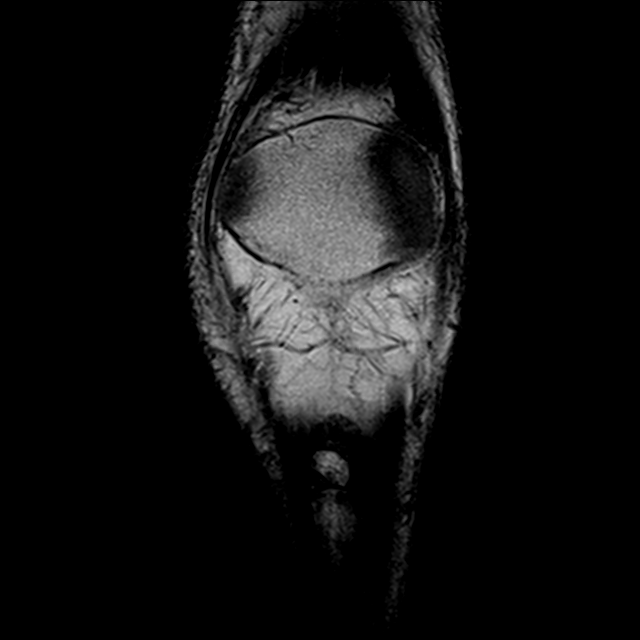
[im 13/26]
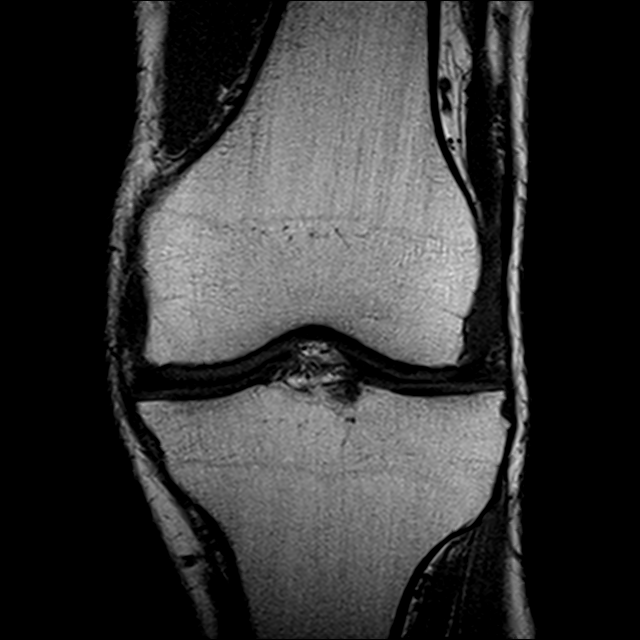
[im 21/26]
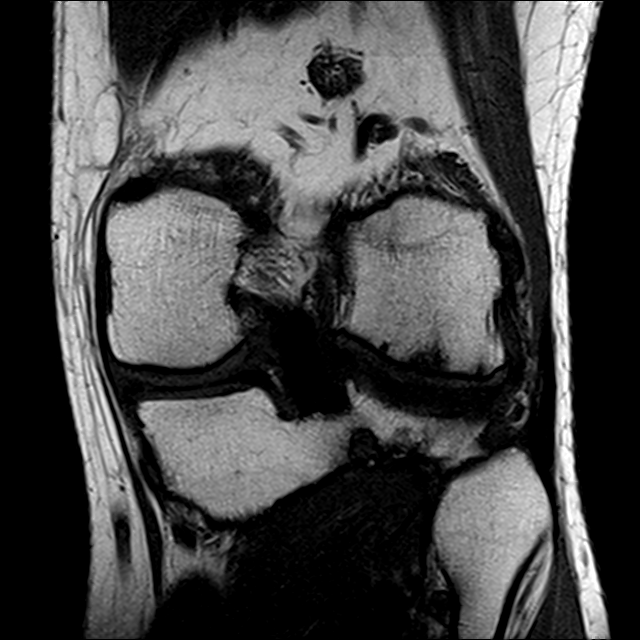

[Series 7: pdfs sag · sagittal · 3.0mm · 0.23mm/px · 3 of 31 slices shown]
[im 5/31]
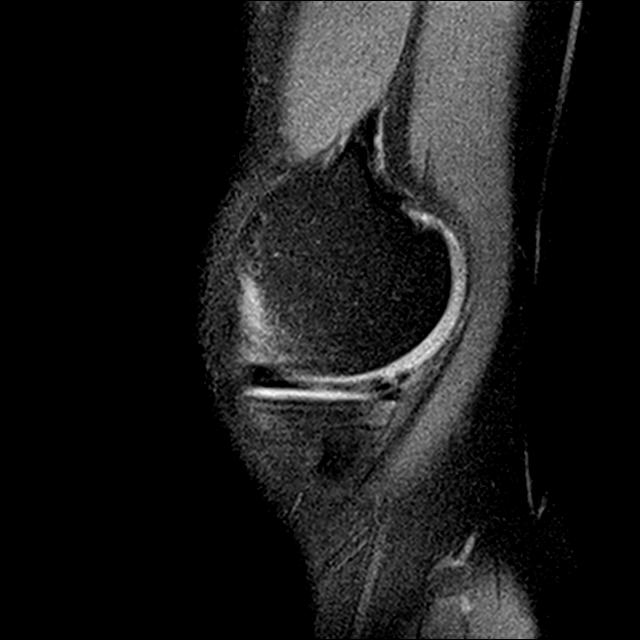
[im 18/31]
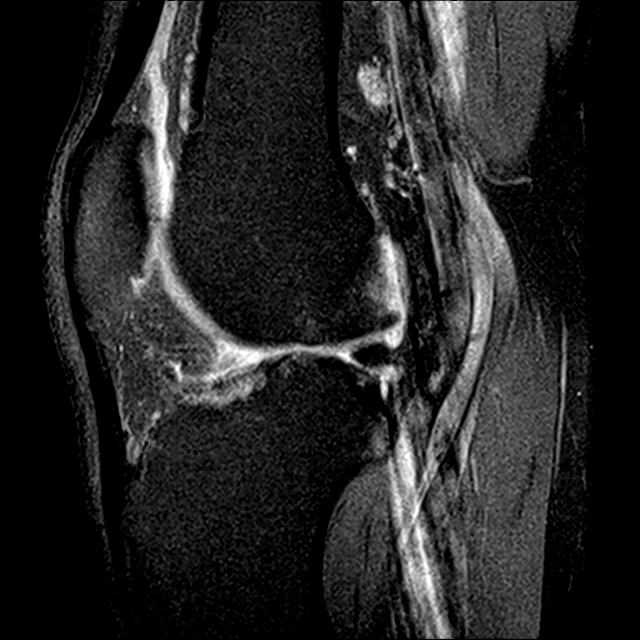
[im 26/31]
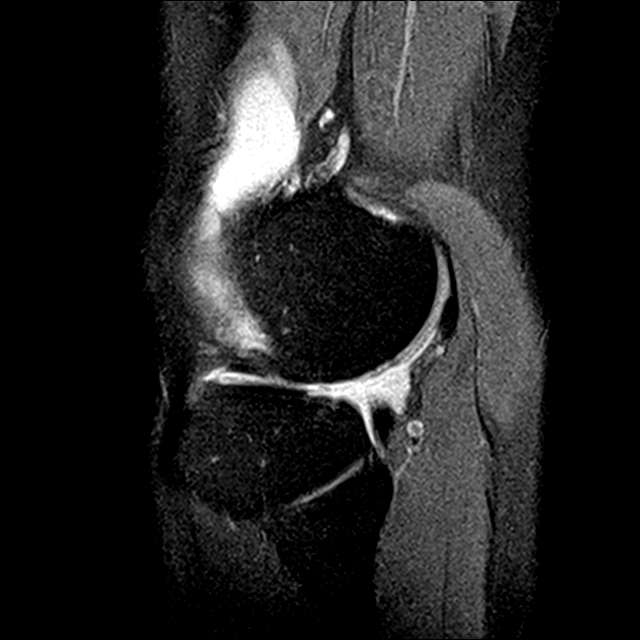

[Series 8: t2fs cor · coronal · 3.0mm · 0.25mm/px · 3 of 26 slices shown]
[im 5/26]
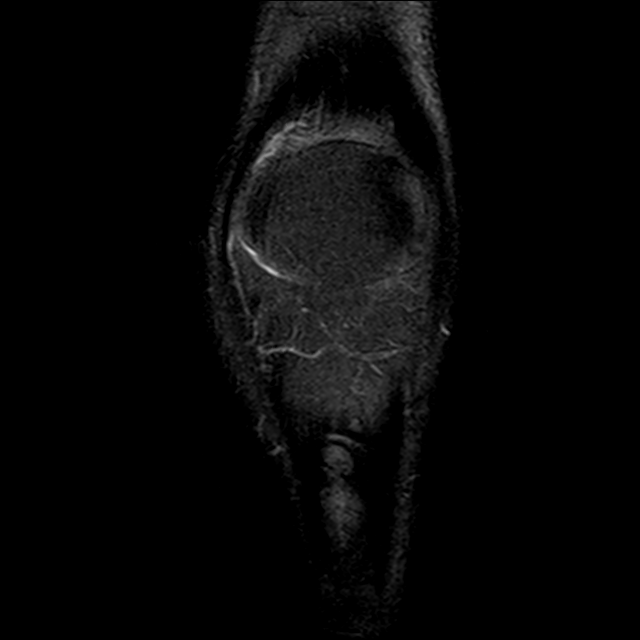
[im 13/26]
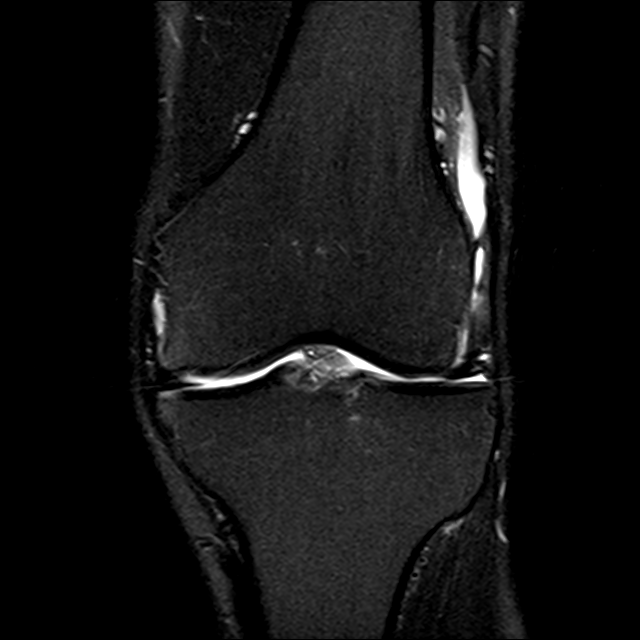
[im 21/26]
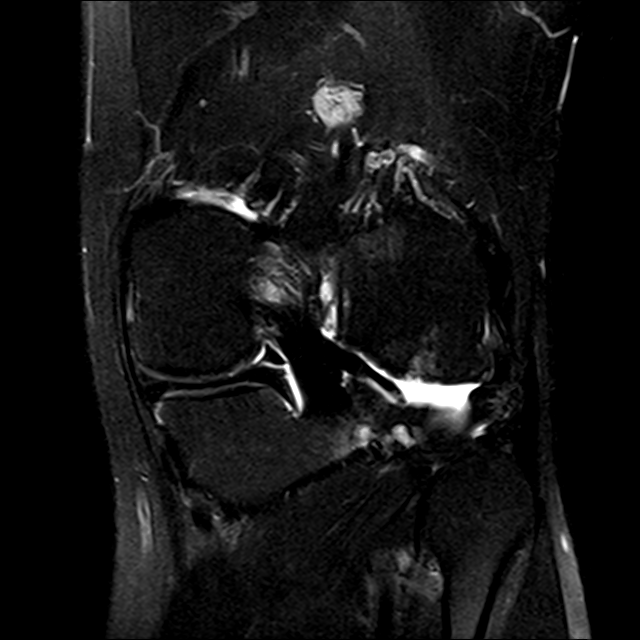

[13 of 40 positions shown; findings below may reference images not displayed]

FINDINGS: MENISCI

Medial meniscus: Complex tearing is seen in the posterior horn of
the medial meniscus extending in the meniscal body where a
horizontal tear reaches the meniscal undersurface. A flap of
meniscus is seen displaced over the superior aspect of the posterior
horn.

Lateral meniscus: There is a very large radial tear in the posterior
horn of the lateral meniscus with a gap in the posterior horn
measuring 1.3 cm transverse identified. Complex degenerative tearing
throughout the body of the lateral meniscus is identified.

LIGAMENTS

Cruciates: There is a chronic, complete anterior cruciate ligament
tear. The posterior cruciate ligament is intact.

Collaterals:  Intact.

CARTILAGE

Patellofemoral:  Appears preserved.

Medial:  Mildly degenerated.

Lateral: Extensive hyaline cartilage loss is present with
bone-on-bone joint space narrowing and subchondral cyst formation.

Joint:  Small joint effusion.

Popliteal Fossa:  No Baker's cyst.

Extensor Mechanism:  Intact.

Bones: Negative for fracture or worrisome lesion. Osteophytes are
present about the medial and lateral compartments.
IMPRESSION: Markedly advanced for age lateral compartment osteoarthritis.

Complex tear posterior horn medial meniscus with horizontal tearing
seen in the body of the medial meniscus. A small meniscal fragment
is displaced over the root of the posterior horn.

Large radial tear posterior horn lateral meniscus. The body of the
lateral meniscus demonstrates complex and diffuse degenerative
tearing.

Chronic, complete ACL tear.

## 2019-03-09 ENCOUNTER — Other Ambulatory Visit: Payer: Self-pay

## 2019-03-09 DIAGNOSIS — Z20822 Contact with and (suspected) exposure to covid-19: Secondary | ICD-10-CM

## 2019-03-10 ENCOUNTER — Encounter: Payer: Self-pay | Admitting: Family Medicine

## 2019-03-10 LAB — NOVEL CORONAVIRUS, NAA: SARS-CoV-2, NAA: DETECTED — AB

## 2019-03-11 ENCOUNTER — Ambulatory Visit (INDEPENDENT_AMBULATORY_CARE_PROVIDER_SITE_OTHER): Payer: Self-pay | Admitting: Family Medicine

## 2019-03-11 ENCOUNTER — Encounter: Payer: Self-pay | Admitting: Family Medicine

## 2019-03-11 ENCOUNTER — Other Ambulatory Visit: Payer: Self-pay

## 2019-03-11 DIAGNOSIS — U071 COVID-19: Secondary | ICD-10-CM

## 2019-03-11 NOTE — Telephone Encounter (Signed)
Patient scheduled phone call this am with Dr Richardson Landry.

## 2019-03-11 NOTE — Progress Notes (Signed)
   Subjective:  Audio only  Patient ID: Aaron Powers, male    DOB: Aug 17, 1975, 43 y.o.   MRN: [id number]  HPI Pt was tested for COVID on 03/09/2019 and found out today that he was positive. Pt states he was tested because he had a cough. Pt states he feels fine but does have a little sore throat and cough.   Virtual Visit via Video Note  I connected with Aaron Powers on 03/11/19 at 10:00 AM EDT by a video enabled telemedicine application and verified that I am speaking with the correct person using two identifiers.  Location: Patient: home Provider: office   I discussed the limitations of evaluation and management by telemedicine and the availability of in person appointments. The patient expressed understanding and agreed to proceed.  History of Present Illness:    Observations/Objective:   Assessment and Plan:   Follow Up Instructions:    I discussed the assessment and treatment plan with the patient. The patient was provided an opportunity to ask questions and all were answered. The patient agreed with the plan and demonstrated an understanding of the instructions.   The patient was advised to call back or seek an in-person evaluation if the symptoms worsen or if the condition fails to improve as anticipated.  I provided 17 minutes of non-face-to-face time during this encounter.   Aaron Males, LPN  Patient notes relatively mild symptoms.  Thus far.  Had a Covid test yesterday was positive.  He was around a Midwife.  Mild nasal congestion mild sore throat.  Mild achiness.  No high fevers no shortness of breath no substantial cough    Review of Systems No vomiting or diarrhea    Objective:   Physical Exam    Virtual    Assessment & Plan:  Impression COVID-19.  Discussed.  Symptom care discussed.  Warning signs discussed.  Home management discussed.

## 2019-03-15 ENCOUNTER — Telehealth: Payer: Self-pay | Admitting: Family Medicine

## 2019-03-15 ENCOUNTER — Encounter: Payer: Self-pay | Admitting: Family Medicine

## 2019-03-15 NOTE — Telephone Encounter (Signed)
Write I will sign

## 2019-03-15 NOTE — Telephone Encounter (Signed)
Ok wis 

## 2019-03-15 NOTE — Telephone Encounter (Signed)
Patient needing new work note to return to work on 11/9.

## 2019-03-15 NOTE — Telephone Encounter (Signed)
Work excuse has been made and waiting for pick up per Danae Chen

## 2019-03-15 NOTE — Telephone Encounter (Signed)
Please advise. Thank you

## 2019-06-14 ENCOUNTER — Encounter: Payer: Self-pay | Admitting: Family Medicine

## 2020-05-04 ENCOUNTER — Ambulatory Visit (INDEPENDENT_AMBULATORY_CARE_PROVIDER_SITE_OTHER): Payer: Self-pay | Admitting: Family Medicine

## 2020-05-04 ENCOUNTER — Other Ambulatory Visit: Payer: Self-pay

## 2020-05-04 ENCOUNTER — Encounter: Payer: Self-pay | Admitting: Family Medicine

## 2020-05-04 VITALS — HR 88 | Temp 98.0°F | Ht 72.0 in | Wt 206.0 lb

## 2020-05-04 DIAGNOSIS — J0110 Acute frontal sinusitis, unspecified: Secondary | ICD-10-CM

## 2020-05-04 MED ORDER — FLUTICASONE PROPIONATE 50 MCG/ACT NA SUSP: 2.0000 | Freq: Every day | NASAL | 1 refills | Status: DC

## 2020-05-04 MED ORDER — AMOXICILLIN 500 MG PO CAPS
500.0000 mg | ORAL_CAPSULE | Freq: Three times a day (TID) | ORAL | 0 refills | Status: DC
Start: 2020-05-04 — End: 2020-06-19

## 2020-05-04 MED ORDER — CHERATUSSIN AC 100-10 MG/5ML PO SOLN: 5.0000 mL | Freq: Three times a day (TID) | ORAL | 0 refills | Status: DC | PRN

## 2020-05-04 NOTE — Progress Notes (Signed)
Patient ID: Aaron Powers, male    DOB: Oct 04, 1975, 44 y.o.   MRN: [id number]   Chief Complaint  Patient presents with  . Cough   Subjective:    HPI  Pt having cough, congestion started about one week ago. Has not had a covid test. No known exposure to covid. Tried otc meds.  Has children at home with recent URI and dx 2 of them with strep throat, last week. Has taken otc cough medicine w/o relief.  Coughing worsening at night with laying down. No fever, sore throat,  eye drainage or ear pain.   Medical History Aaron Powers has a past medical history of Arthritis.   Outpatient Encounter Medications as of 05/04/2020  Medication Sig  . amoxicillin (AMOXIL) 500 MG capsule Take 1 capsule (500 mg total) by mouth 3 (three) times daily.  . fluticasone (FLONASE) 50 MCG/ACT nasal spray Place 2 sprays into both nostrils daily.  Marland Kitchen guaiFENesin-codeine (CHERATUSSIN AC) 100-10 MG/5ML syrup Take 5 mLs by mouth 3 (three) times daily as needed for cough.  . [DISCONTINUED] ibuprofen (ADVIL,MOTRIN) 800 MG tablet Take 1 tablet (800 mg total) by mouth every 8 (eight) hours as needed.  . [DISCONTINUED] oseltamivir (TAMIFLU) 75 MG capsule Take 1 capsule (75 mg total) by mouth 2 (two) times daily. (Patient not taking: Reported on 03/11/2019)   No facility-administered encounter medications on file as of 05/04/2020.     Review of Systems  Constitutional: Negative for chills and fever.  HENT: Positive for congestion and rhinorrhea. Negative for ear pain, sinus pressure, sinus pain, sneezing and sore throat.   Eyes: Negative for pain, discharge and itching.  Respiratory: Positive for cough.   Gastrointestinal: Negative for diarrhea, nausea and vomiting.  Skin: Negative for rash.  Neurological: Negative for headaches.     Vitals Pulse 88   Temp 98 F (36.7 C)   Ht 6' (1.829 m)   Wt 206 lb (93.4 kg)   SpO2 97%   BMI 27.94 kg/m   Objective:   Physical Exam Vitals and nursing note  reviewed.  Constitutional:      General: He is not in acute distress.    Appearance: Normal appearance. He is not ill-appearing.  HENT:     Head: Normocephalic and atraumatic.     Right Ear: Tympanic membrane, ear canal and external ear normal.     Left Ear: Tympanic membrane, ear canal and external ear normal.     Nose: Nose normal. No congestion or rhinorrhea.     Comments: +sinus ttp over frontal and maxillary    Mouth/Throat:     Mouth: Mucous membranes are moist.     Pharynx: No oropharyngeal exudate or posterior oropharyngeal erythema.  Eyes:     Extraocular Movements: Extraocular movements intact.     Conjunctiva/sclera: Conjunctivae normal.     Pupils: Pupils are equal, round, and reactive to light.  Cardiovascular:     Rate and Rhythm: Normal rate and regular rhythm.     Pulses: Normal pulses.     Heart sounds: Normal heart sounds. No murmur heard.   Pulmonary:     Effort: Pulmonary effort is normal. No respiratory distress.     Breath sounds: No wheezing, rhonchi or rales.  Musculoskeletal:     Cervical back: Normal range of motion.  Skin:    General: Skin is warm and dry.     Findings: No rash.  Neurological:     Mental Status: He is alert.  Assessment and Plan   1. Acute non-recurrent frontal sinusitis - amoxicillin (AMOXIL) 500 MG capsule; Take 1 capsule (500 mg total) by mouth 3 (three) times daily.  Dispense: 30 capsule; Refill: 0 - guaiFENesin-codeine (CHERATUSSIN AC) 100-10 MG/5ML syrup; Take 5 mLs by mouth 3 (three) times daily as needed for cough.  Dispense: 100 mL; Refill: 0 - fluticasone (FLONASE) 50 MCG/ACT nasal spray; Place 2 sprays into both nostrils daily.  Dispense: 16 g; Refill: 1    Sinusitis- treated with amoxicillin 10 days. For severe coughing gave cheratussin.  mucinex during the day and increase fluids.  Flonase, tylenol or ibuprofen. Call or rto if not improving.  F/u prn.

## 2020-05-22 ENCOUNTER — Telehealth: Payer: Self-pay | Admitting: Family Medicine

## 2020-05-22 NOTE — Telephone Encounter (Signed)
Pt having cough and sore throat. Please advise. Thank you

## 2020-05-22 NOTE — Telephone Encounter (Signed)
Pt contacted. Pt states he will have to call back in the morning to set up appt

## 2020-05-22 NOTE — Telephone Encounter (Signed)
Patient was seen 12.23 with sorethroat and cough and states now its came back. He is requesting Zpak called into [place]

## 2020-05-22 NOTE — Telephone Encounter (Signed)
Needs exam. Dr. Ladona Ridgel

## 2020-05-25 ENCOUNTER — Ambulatory Visit: Payer: Self-pay | Admitting: Family Medicine

## 2020-06-19 ENCOUNTER — Other Ambulatory Visit: Payer: Self-pay

## 2020-06-19 ENCOUNTER — Telehealth: Payer: Self-pay

## 2020-06-19 ENCOUNTER — Telehealth: Payer: Self-pay | Admitting: Family Medicine

## 2020-06-19 ENCOUNTER — Telehealth (INDEPENDENT_AMBULATORY_CARE_PROVIDER_SITE_OTHER): Payer: Self-pay | Admitting: Family Medicine

## 2020-06-19 DIAGNOSIS — J069 Acute upper respiratory infection, unspecified: Secondary | ICD-10-CM

## 2020-06-19 MED ORDER — ALBUTEROL SULFATE HFA 108 (90 BASE) MCG/ACT IN AERS: 2.0000 | INHALATION_SPRAY | RESPIRATORY_TRACT | 0 refills | Status: DC | PRN

## 2020-06-19 MED ORDER — MOLNUPIRAVIR EUA 200MG CAPSULE: 4.0000 | ORAL_CAPSULE | Freq: Two times a day (BID) | ORAL | 0 refills | Status: AC

## 2020-06-19 NOTE — Telephone Encounter (Signed)
Mr. javone, ybanez are scheduled for a virtual visit with your provider today.    Just as we do with appointments in the office, we must obtain your consent to participate.  Your consent will be active for this visit and any virtual visit you may have with one of our providers in the next 365 days.    If you have a MyChart account, I can also send a copy of this consent to you electronically.  All virtual visits are billed to your insurance company just like a traditional visit in the office.  As this is a virtual visit, video technology does not allow for your provider to perform a traditional examination.  This may limit your provider's ability to fully assess your condition.  If your provider identifies any concerns that need to be evaluated in person or the need to arrange testing such as labs, EKG, etc, we will make arrangements to do so.    Although advances in technology are sophisticated, we cannot ensure that it will always work on either your end or our end.  If the connection with a video visit is poor, we may have to switch to a telephone visit.  With either a video or telephone visit, we are not always able to ensure that we have a secure connection.   I need to obtain your verbal consent now.   Are you willing to proceed with your visit today?   [address] Igarashi has provided verbal consent on 06/19/2020 for a virtual visit (video or telephone).   Marlowe Shores, LPN 07/19/1827  9:37 AM

## 2020-06-19 NOTE — Telephone Encounter (Signed)
Pt had phone visit with Dr Ladona Ridgel this morning Pt went and took rapid Covid test and tested positive for Covid   Pt call back [phone]

## 2020-06-19 NOTE — Telephone Encounter (Signed)
Pt contacted and verbalized understanding.  

## 2020-06-19 NOTE — Telephone Encounter (Signed)
Please advise. Thank you

## 2020-06-19 NOTE — Telephone Encounter (Signed)
Does pt want to try to take the anti-viral oral pills (molnupiravir).   (if so need to call pharmacy to see which one has it.)  Only 40% effective in reducing the symptoms, from the pharmacist.   Or does he want to just take otc medications and the inhaler?  If his symptoms are mild I would say he could just take the meds he has.  He's not a high risk pt.  Thx,   Dr. Ladona Ridgel

## 2020-06-19 NOTE — Progress Notes (Signed)
Patient ID: Aaron Powers, male    DOB: 11-07-1975, 45 y.o.   MRN: [id number]   Virtual Visit via Telephone Note  I connected with [name] on 06/19/20 at 10:40 AM EST by telephone and verified that I am speaking with the correct person using two identifiers.  Location: Patient: home Provider: office   I discussed the limitations, risks, security and privacy concerns of performing an evaluation and management service by telephone and the availability of in person appointments. I also discussed with the patient that there may be a patient responsible charge related to this service. The patient expressed understanding and agreed to proceed.   Chief Complaint  Patient presents with  . Cough   Subjective:    HPI Pt having cough and body aches. Pt reports some chest pain when coughing. Symptoms started 2 days ago.  Pt has been taking Tylenol and Aleve. No COVID test at this time.  Had this a few wks ago but now having it on opposite side of chest.   meds- tylenol and motrin. Non productive cough. No sore throat or ear pain.  Was seen in 05/04/20.  Had dx sinusitis and given amoxicillin 10 days.  Son sick last week. positive for strep throat.    Medical History Dael has a past medical history of Arthritis.   Outpatient Encounter Medications as of 06/19/2020  Medication Sig  . albuterol (VENTOLIN HFA) 108 (90 Base) MCG/ACT inhaler Inhale 2 puffs into the lungs every 4 (four) hours as needed for wheezing or shortness of breath.  . fluticasone (FLONASE) 50 MCG/ACT nasal spray Place 2 sprays into both nostrils daily.  Marland Kitchen guaiFENesin-codeine (CHERATUSSIN AC) 100-10 MG/5ML syrup Take 5 mLs by mouth 3 (three) times daily as needed for cough. (Patient not taking: Reported on 06/19/2020)  . [DISCONTINUED] amoxicillin (AMOXIL) 500 MG capsule Take 1 capsule (500 mg total) by mouth 3 (three) times daily.   No facility-administered encounter medications on file as of  06/19/2020.     Review of Systems  Constitutional: Negative for chills and fever.  HENT: Negative for congestion, rhinorrhea and sore throat.   Respiratory: Positive for cough and chest tightness. Negative for shortness of breath and wheezing.   Cardiovascular: Negative for chest pain and leg swelling.  Gastrointestinal: Negative for abdominal pain, diarrhea, nausea and vomiting.  Genitourinary: Negative for dysuria and frequency.  Musculoskeletal: Positive for myalgias.  Skin: Negative for rash.  Neurological: Negative for dizziness, weakness and headaches.     Vitals There were no vitals taken for this visit.  Objective:   Physical Exam  No PE due to phone visit.  Assessment and Plan   1. Viral URI with cough - albuterol (VENTOLIN HFA) 108 (90 Base) MCG/ACT inhaler; Inhale 2 puffs into the lungs every 4 (four) hours as needed for wheezing or shortness of breath.  Dispense: 18 g; Refill: 0    Pt likely has viral syndrome.  Pt recently on amoxicillin for 10 days. Gave inhaler to help with coughing.  Pt to increase fluids and take mucinex.  Pt still has cough syrup, cheratussin from last visit.  Taking this for night time. Pt recommended to get covid testing today. Pt to call if worsening with coughing, fever, or sob.  Pt in agreement.  Pt to call back if positive for covid for further guidance. F/u prn.   Follow Up Instructions:    I discussed the assessment and treatment plan with the patient. The patient was  provided an opportunity to ask questions and all were answered. The patient agreed with the plan and demonstrated an understanding of the instructions.   The patient was advised to call back or seek an in-person evaluation if the symptoms worsen or if the condition fails to improve as anticipated.  I provided 12 minutes of non-face-to-face time during this encounter.

## 2020-06-19 NOTE — Telephone Encounter (Signed)
Pt contacted and would like to try anti viral oral pills. [facility] has these in stock at this time. Please advise. Thank you

## 2020-08-22 ENCOUNTER — Ambulatory Visit (INDEPENDENT_AMBULATORY_CARE_PROVIDER_SITE_OTHER): Payer: Self-pay | Admitting: Family Medicine

## 2020-08-22 ENCOUNTER — Other Ambulatory Visit: Payer: Self-pay

## 2020-08-22 ENCOUNTER — Encounter: Payer: Self-pay | Admitting: Family Medicine

## 2020-08-22 VITALS — HR 61 | Temp 97.6°F | Resp 16 | Wt 206.0 lb

## 2020-08-22 DIAGNOSIS — J301 Allergic rhinitis due to pollen: Secondary | ICD-10-CM

## 2020-08-22 DIAGNOSIS — J40 Bronchitis, not specified as acute or chronic: Secondary | ICD-10-CM

## 2020-08-22 DIAGNOSIS — R059 Cough, unspecified: Secondary | ICD-10-CM

## 2020-08-22 MED ORDER — PREDNISONE 20 MG PO TABS: 20.0000 mg | ORAL_TABLET | Freq: Two times a day (BID) | ORAL | 0 refills | Status: DC

## 2020-08-22 MED ORDER — CETIRIZINE HCL 10 MG PO TABS: 10.0000 mg | ORAL_TABLET | Freq: Every day | ORAL | 1 refills | Status: DC

## 2020-08-22 MED ORDER — CHERATUSSIN AC 100-10 MG/5ML PO SOLN: 5.0000 mL | Freq: Three times a day (TID) | ORAL | 0 refills | Status: DC | PRN

## 2020-08-22 MED ORDER — ALBUTEROL SULFATE HFA 108 (90 BASE) MCG/ACT IN AERS: 2.0000 | INHALATION_SPRAY | RESPIRATORY_TRACT | 0 refills | Status: DC | PRN

## 2020-08-22 NOTE — Patient Instructions (Signed)
Acute Bronchitis, Adult  Acute bronchitis is sudden or acute swelling of the air tubes (bronchi) in the lungs. Acute bronchitis causes these tubes to fill with mucus, which can make it hard to breathe. It can also cause coughing or wheezing. In adults, acute bronchitis usually goes away within 2 weeks. A cough caused by bronchitis may last up to 3 weeks. Smoking, allergies, and asthma can make the condition worse. What are the causes? This condition can be caused by germs and by substances that irritate the lungs, including:  Cold and flu viruses. The most common cause of this condition is the virus that causes the common cold.  Bacteria.  Substances that irritate the lungs, including: ? Smoke from cigarettes and other forms of tobacco. ? Dust and pollen. ? Fumes from chemical products, gases, or burned fuel. ? Other materials that pollute indoor or outdoor air.  Close contact with someone who has acute bronchitis. What increases the risk? The following factors may make you more likely to develop this condition:  A weak body's defense system, also called the immune system.  A condition that affects your lungs and breathing, such as asthma. What are the signs or symptoms? Common symptoms of this condition include:  Lung and breathing problems, such as: ? Coughing. This may bring up clear, yellow, or green mucus from your lungs (sputum). ? Wheezing. ? Having too much mucus in your lungs (chest congestion). ? Having shortness of breath.  A fever.  Chills.  Aches and pains, including: ? Tightness in your chest and other body aches. ? A sore throat. How is this diagnosed? This condition is usually diagnosed based on:  Your symptoms and medical history.  A physical exam. You may also have other tests, including tests to rule out other conditions, such pneumonia. These tests include:  A test of lung function.  Test of a mucus sample to look for the presence of  bacteria.  Tests to check the oxygen level in your blood.  Blood tests.  Chest X-ray. How is this treated? Most cases of acute bronchitis clear up over time without treatment. Your health care provider may recommend:  Drinking more fluids. This can thin your mucus, which may improve your breathing.  Taking a medicine for a fever or cough.  Using a device that gets medicine into your lungs (inhaler) to help improve breathing and control coughing.  Using a vaporizer or a humidifier. These are machines that add water to the air to help you breathe better. Follow these instructions at home: Activity  Get plenty of rest.  Return to your normal activities as told by your health care provider. Ask your health care provider what activities are safe for you. Lifestyle  Drink enough fluid to keep your urine pale yellow.  Do not drink alcohol.  Do not use any products that contain nicotine or tobacco, such as cigarettes, e-cigarettes, and chewing tobacco. If you need help quitting, ask your health care provider. Be aware that: ? Your bronchitis will get worse if you smoke or breathe in other people's smoke (secondhand smoke). ? Your lungs will heal faster if you quit smoking. General instructions  Take over-the-counter and prescription medicines only as told by your health care provider.  Use an inhaler, vaporizer, or humidifier as told by your health care provider.  If you have a sore throat, gargle with a salt-water mixture 3-4 times a day or as needed. To make a salt-water mixture, completely dissolve -1 tsp (3-6 g)   of salt in 1 cup (237 mL) of warm water.  Keep all follow-up visits as told by your health care provider. This is important.   How is this prevented? To lower your risk of getting this condition again:  Wash your hands often with soap and water. If soap and water are not available, use hand sanitizer.  Avoid contact with people who have cold symptoms.  Try not to  touch your mouth, nose, or eyes with your hands.  Avoid places where there are fumes from chemicals. Breathing these fumes will make your condition worse.  Get the flu shot every year.   Contact a health care provider if:  Your symptoms do not improve after 2 weeks of treatment.  You vomit more than once or twice.  You have symptoms of dehydration such as: ? Dark urine. ? Dry skin or eyes. ? Increased thirst. ? Headaches. ? Confusion. ? Muscle cramps. Get help right away if you:  Cough up blood.  Feel pain in your chest.  Have severe shortness of breath.  Faint or keep feeling like you are going to faint.  Have a severe headache.  Have fever or chills that get worse. These symptoms may represent a serious problem that is an emergency. Do not wait to see if the symptoms will go away. Get medical help right away. Call your local emergency services (911 in the [place] Do not drive yourself to the hospital. Summary  Acute bronchitis is sudden (acute) inflammation of the air tubes (bronchi) between the windpipe and the lungs. In adults, acute bronchitis usually goes away within 2 weeks, although coughing may last 3 weeks or longer  Take over-the-counter and prescription medicines only as told by your health care provider.  Drink enough fluid to keep your urine pale yellow.  Contact a health care provider if your symptoms do not improve after 2 weeks of treatment.  Get help right away if you cough up blood, faint, or have chest pain or shortness of breath. This information is not intended to replace advice given to you by your health care provider. Make sure you discuss any questions you have with your health care provider. Document Revised: 01/11/2019 Document Reviewed: 11/20/2018 Elsevier Patient Education  2021 Elsevier Inc.  

## 2020-08-22 NOTE — Progress Notes (Signed)
Patient ID: Aaron Powers, male    DOB: 06-25-1975, 45 y.o.   MRN: [id number]   Chief Complaint  Patient presents with  . Cough    Mainly non-productive x 3 weeks - took otc med not helping- cough worsens at night   Subjective:  CC: cough worse at night   This is a new problem.  Presents today for cough mostly nonproductive that has been present for 3 weeks.  Reports that he does not feel that bad, has tried over-the-counter cough medications which is not helping and nasal spray also not helping.  Denies fever, chills, chest pain, shortness of breath    Medical History Almer has a past medical history of Arthritis.   Outpatient Encounter Medications as of 08/22/2020  Medication Sig  . cetirizine (ZYRTEC ALLERGY) 10 MG tablet Take 1 tablet (10 mg total) by mouth daily.  . predniSONE (DELTASONE) 20 MG tablet Take 1 tablet (20 mg total) by mouth 2 (two) times daily with a meal.  . albuterol (VENTOLIN HFA) 108 (90 Base) MCG/ACT inhaler Inhale 2 puffs into the lungs every 4 (four) hours as needed for wheezing or shortness of breath.  . fluticasone (FLONASE) 50 MCG/ACT nasal spray Place 2 sprays into both nostrils daily. (Patient not taking: Reported on 08/22/2020)  . guaiFENesin-codeine (CHERATUSSIN AC) 100-10 MG/5ML syrup Take 5 mLs by mouth 3 (three) times daily as needed for cough.  . [DISCONTINUED] albuterol (VENTOLIN HFA) 108 (90 Base) MCG/ACT inhaler Inhale 2 puffs into the lungs every 4 (four) hours as needed for wheezing or shortness of breath. (Patient not taking: Reported on 08/22/2020)  . [DISCONTINUED] guaiFENesin-codeine (CHERATUSSIN AC) 100-10 MG/5ML syrup Take 5 mLs by mouth 3 (three) times daily as needed for cough. (Patient not taking: No sig reported)   No facility-administered encounter medications on file as of 08/22/2020.     Review of Systems  Constitutional: Negative for chills and fever.  HENT: Negative for ear pain.   Respiratory: Positive for cough and  shortness of breath.        Reports chest hurts on the left side with coughing. Reports shortness of breath with coughing.  Cardiovascular: Negative for chest pain.  Gastrointestinal: Negative for abdominal pain.     Vitals Pulse 61   Temp 97.6 F (36.4 C)   Resp 16   Wt 206 lb (93.4 kg)   SpO2 98%   BMI 27.94 kg/m   Objective:   Physical Exam Vitals reviewed.  HENT:     Right Ear: Tympanic membrane normal.     Left Ear: Tympanic membrane normal.     Nose:     Right Turbinates: Swollen.     Left Turbinates: Swollen.     Mouth/Throat:     Pharynx: Oropharynx is clear. Uvula midline. No posterior oropharyngeal erythema.  Cardiovascular:     Rate and Rhythm: Normal rate and regular rhythm.     Heart sounds: Normal heart sounds.  Pulmonary:     Effort: Pulmonary effort is normal.     Breath sounds: Normal breath sounds.  Skin:    General: Skin is warm and dry.  Neurological:     General: No focal deficit present.     Mental Status: He is alert.  Psychiatric:        Behavior: Behavior normal.      Assessment and Plan   1. Bronchitis - albuterol (VENTOLIN HFA) 108 (90 Base) MCG/ACT inhaler; Inhale 2 puffs into the lungs every 4 (four) hours  as needed for wheezing or shortness of breath.  Dispense: 18 g; Refill: 0 - predniSONE (DELTASONE) 20 MG tablet; Take 1 tablet (20 mg total) by mouth 2 (two) times daily with a meal.  Dispense: 10 tablet; Refill: 0  2. Cough in adult - albuterol (VENTOLIN HFA) 108 (90 Base) MCG/ACT inhaler; Inhale 2 puffs into the lungs every 4 (four) hours as needed for wheezing or shortness of breath.  Dispense: 18 g; Refill: 0 - guaiFENesin-codeine (CHERATUSSIN AC) 100-10 MG/5ML syrup; Take 5 mLs by mouth 3 (three) times daily as needed for cough.  Dispense: 100 mL; Refill: 0  3. Seasonal allergic rhinitis due to pollen - cetirizine (ZYRTEC ALLERGY) 10 MG tablet; Take 1 tablet (10 mg total) by mouth daily.  Dispense: 30 tablet; Refill: 1    Lung sounds clear, no obvious shortness of breath during examination.  Will treat persistent cough as bronchitis with short course prednisone.  Reports albuterol inhaler received at a previous visit has been somewhat helpful, will refill.  Request Cheratussin cough medication as prescribed at a previous visit.  Allergic rhinitis: Will treat with Zyrtec daily during pollen season.  Agrees with plan of care discussed today. Understands warning signs to seek further care: chest pain, shortness of breath, any significant change in health.  Understands to follow-up if symptoms do not improve, or worsen.  If chest does not feel better after prednisone course, return, will order chest x-ray.  Lungs are clear today, very low suspicion for pneumonia.    Dorena Bodo, NP 08/22/2020

## 2020-11-10 ENCOUNTER — Encounter: Payer: Self-pay | Admitting: Emergency Medicine

## 2020-11-10 ENCOUNTER — Ambulatory Visit
Admission: EM | Admit: 2020-11-10 | Discharge: 2020-11-10 | Disposition: A | Discharge status: Home / Self Care | Payer: 59 | Attending: Emergency Medicine | Admitting: Emergency Medicine

## 2020-11-10 ENCOUNTER — Other Ambulatory Visit: Payer: Self-pay

## 2020-11-10 DIAGNOSIS — R059 Cough, unspecified: Secondary | ICD-10-CM

## 2020-11-10 DIAGNOSIS — J40 Bronchitis, not specified as acute or chronic: Secondary | ICD-10-CM

## 2020-11-10 DIAGNOSIS — J209 Acute bronchitis, unspecified: Secondary | ICD-10-CM

## 2020-11-10 MED ORDER — ALBUTEROL SULFATE HFA 108 (90 BASE) MCG/ACT IN AERS: 1.0000 | INHALATION_SPRAY | Freq: Four times a day (QID) | RESPIRATORY_TRACT | 0 refills | Status: AC | PRN

## 2020-11-10 MED ORDER — CETIRIZINE HCL 10 MG PO TABS: 10.0000 mg | ORAL_TABLET | Freq: Every day | ORAL | 0 refills | Status: DC

## 2020-11-10 MED ORDER — PREDNISONE 20 MG PO TABS: 20.0000 mg | ORAL_TABLET | Freq: Two times a day (BID) | ORAL | 0 refills | Status: AC

## 2020-11-10 NOTE — ED Provider Notes (Signed)
[facility]   [id number] 11/10/20 Arrival Time: 0831  Cc: COUGH  SUBJECTIVE:  Aaron Powers is a 45 y.o. male who presents with nonproductive cough x 2 weeks.  Denies positive sick exposure or precipitating event.  Describes cough as intermittent and dry.  Has tried OTC medications without relief.  Symptoms are made worse at night.  Reports previous symptoms in the past with bronchitis, treated with steroids with relief.   Denies fever, chills, fatigue, sinus pain, rhinorrhea, sore throat, SOB, wheezing, chest pain, nausea, changes in bowel or bladder habits.    ROS: As per HPI.  All other pertinent ROS negative.     Past Medical History:  Diagnosis Date   Arthritis    Past Surgical History:  Procedure Laterality Date   ANTERIOR CRUCIATE LIGAMENT REPAIR Left 02/15/2016   Procedure: RECONSTRUCTION LEFT ANTERIOR CRUCIATE LIGAMENT WITH AUTOGRAFT;  Surgeon: Vickki Hearing, MD;  Location: [facility];  Service: Orthopedics;  Laterality: Left;   KNEE ARTHROSCOPY WITH MEDIAL MENISECTOMY Left 02/23/2014   Procedure: KNEE ARTHROSCOPY ;  Surgeon: Vickki Hearing, MD;  Location: [facility];  Service: Orthopedics;  Laterality: Left;  medial and lateral menisectomy   MENISECTOMY Left 02/23/2014   Procedure: Medial and Lateral MENISECTOMY;  Surgeon: Vickki Hearing, MD;  Location: [facility];  Service: Orthopedics;  Laterality: Left;   No Known Allergies No current facility-administered medications on file prior to encounter.   Current Outpatient Medications on File Prior to Encounter  Medication Sig Dispense Refill   fluticasone (FLONASE) 50 MCG/ACT nasal spray Place 2 sprays into both nostrils daily. (Patient not taking: Reported on 08/22/2020) 16 g 1   guaiFENesin-codeine (CHERATUSSIN AC) 100-10 MG/5ML syrup Take 5 mLs by mouth 3 (three) times daily as needed for cough. 100 mL 0    Social History   Socioeconomic History   Marital status: Married    Spouse name: Not on file    Number of children: Not on file   Years of education: Not on file   Highest education level: Not on file  Occupational History   Not on file  Tobacco Use   Smoking status: Never   Smokeless tobacco: Never  Substance and Sexual Activity   Alcohol use: No   Drug use: No   Sexual activity: Yes    Birth control/protection: None  Other Topics Concern   Not on file  Social History Narrative   Not on file   Social Determinants of Health   Financial Resource Strain: Not on file  Food Insecurity: Not on file  Transportation Needs: Not on file  Physical Activity: Not on file  Stress: Not on file  Social Connections: Not on file  Intimate Partner Violence: Not on file   Family History  Problem Relation Age of Onset   Healthy Mother    Healthy Father      OBJECTIVE:  Vitals:   11/10/20 0842  BP: (!) 145/82  Pulse: 60  Resp: 16  Temp: 98 F (36.7 C)  TempSrc: Oral  SpO2: 96%    General appearance: Alert, well-appearing, nontoxic; speaking in full sentences without difficulty HEENT:NCAT; Ears: EACs clear, TMs pearly gray; Eyes: PERRL.  EOM grossly intact. Nose: nares patent without rhinorrhea; Throat: tonsils nonerythematous or enlarged, uvula midline  Neck: supple without LAD Lungs: clear to auscultation bilaterally without adventitious breath sounds; normal respiratory effort; mild cough present Heart: regular rate and rhythm.   Skin: warm and dry Psychological: alert and cooperative; normal mood and  affect  ASSESSMENT & PLAN:  1. Cough   2. Acute bronchitis, unspecified organism   3. Bronchitis   4. Cough in adult     Meds ordered this encounter  Medications   albuterol (VENTOLIN HFA) 108 (90 Base) MCG/ACT inhaler    Sig: Inhale 1-2 puffs into the lungs every 6 (six) hours as needed for wheezing or shortness of breath.    Dispense:  18 g    Refill:  0    Order Specific Question:   Supervising Provider    Answer:   Eustace Moore [[id number]]   cetirizine  (ZYRTEC) 10 MG tablet    Sig: Take 1 tablet (10 mg total) by mouth daily.    Dispense:  30 tablet    Refill:  0    Order Specific Question:   Supervising Provider    Answer:   Eustace Moore [[id number]]   predniSONE (DELTASONE) 20 MG tablet    Sig: Take 1 tablet (20 mg total) by mouth 2 (two) times daily with a meal for 5 days.    Dispense:  10 tablet    Refill:  0    Order Specific Question:   Supervising Provider    Answer:   Eustace Moore [[id number]]    No orders of the defined types were placed in this encounter.   Get plenty of rest and push fluids Prescribed tessolone perles and/or albuterol inhaler as needed for cough Prednisone prescribed.  Take as directed and to completion Use OTC medication as needed for symptomatic relief Follow up with PCP for recheck and/or if symptoms persists Return or go to ER if you have any new or worsening symptoms such as fever, chills, fatigue, shortness of breath, wheezing, chest pain, nausea, changes in bowel or bladder habits, etc...  Reviewed expectations re: course of current medical issues. Questions answered. Outlined signs and symptoms indicating need for more acute intervention. Patient verbalized understanding. After Visit Summary given.           Alvino Chapel Tutwiler, PA-C 11/10/20 [phone]

## 2020-11-10 NOTE — ED Triage Notes (Signed)
Non productive cough x 2 weeks worse at night.  Tried otc meds with no relief.

## 2020-11-10 NOTE — Discharge Instructions (Addendum)
Get plenty of rest and push fluids Prescribed tessolone perles and/or albuterol inhaler as needed for cough Prednisone prescribed.  Take as directed and to completion Use OTC medication as needed for symptomatic relief Follow up with PCP for recheck and/or if symptoms persists Return or go to ER if you have any new or worsening symptoms such as fever, chills, fatigue, shortness of breath, wheezing, chest pain, nausea, changes in bowel or bladder habits, etc..Marland Kitchen

## 2021-02-22 ENCOUNTER — Other Ambulatory Visit: Payer: Self-pay

## 2021-02-22 ENCOUNTER — Ambulatory Visit (INDEPENDENT_AMBULATORY_CARE_PROVIDER_SITE_OTHER): Payer: 59 | Admitting: Family Medicine

## 2021-02-22 VITALS — BP 122/82 | HR 86 | Temp 100.2°F | Wt 200.3 lb

## 2021-02-22 DIAGNOSIS — J069 Acute upper respiratory infection, unspecified: Secondary | ICD-10-CM | POA: Diagnosis not present

## 2021-02-22 MED ORDER — PROMETHAZINE-DM 6.25-15 MG/5ML PO SYRP: 5.0000 mL | ORAL_SOLUTION | Freq: Four times a day (QID) | ORAL | 0 refills | Status: DC | PRN

## 2021-02-22 NOTE — Progress Notes (Signed)
Subjective:  Patient ID: Aaron Powers, male    DOB: 1976/04/17  Age: 45 y.o. MRN: [id number]  CC: Chief Complaint  Patient presents with   Cough    Slight fever and congestion for a few days     HPI:  45 year old male presents with respiratory symptoms.  2-day history of symptoms.  No reported sick contacts.  Patient states that he has had a fever, T-max 101.  He also reports cough and congestion.  He states that he has had severe cough at night.  Interferes with sleep.  He has been taking over-the-counter medication without relief.  No other associated symptoms.  No other complaints.  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/18/2014   Meniscus, lateral, derangement 02/18/2014   Medial meniscus, posterior horn derangement 02/18/2014   Torn medial meniscus 02/10/2014    Social Hx   Social History   Socioeconomic History   Marital status: Married    Spouse name: Not on file   Number of children: Not on file   Years of education: Not on file   Highest education level: Not on file  Occupational History   Not on file  Tobacco Use   Smoking status: Never   Smokeless tobacco: Never  Substance and Sexual Activity   Alcohol use: No   Drug use: No   Sexual activity: Yes    Birth control/protection: None  Other Topics Concern   Not on file  Social History Narrative   Not on file   Social Determinants of Health   Financial Resource Strain: Not on file  Food Insecurity: Not on file  Transportation Needs: Not on file  Physical Activity: Not on file  Stress: Not on file  Social Connections: Not on file    Review of Systems Per HPI  Objective:  BP 122/82   Pulse 86   Temp 100.2 F (37.9 C) (Oral)   Wt  200 lb 4.8 oz (90.9 kg)   SpO2 96%   BMI 27.17 kg/m   BP/Weight 02/22/2021 11/10/2020 08/22/2020  Systolic BP 122 145 -  Diastolic BP 82 82 -  Wt. (Lbs) 200.3 - 206  BMI 27.17 - 27.94    Physical Exam Vitals and nursing note reviewed.  Constitutional:      General: He is not in acute distress. HENT:     Head: Normocephalic and atraumatic.     Right Ear: Tympanic membrane normal.     Left Ear: Tympanic membrane normal.     Mouth/Throat:     Pharynx: Posterior oropharyngeal erythema present. No oropharyngeal exudate.  Eyes:     General:        Right eye: No discharge.        Left eye: No discharge.     Conjunctiva/sclera: Conjunctivae normal.  Cardiovascular:     Rate and Rhythm: Normal rate and regular rhythm.     Heart sounds: No murmur heard. Pulmonary:     Effort: Pulmonary effort is normal.     Breath sounds: Normal breath sounds. No wheezing or rales.  Musculoskeletal:     Cervical back: Neck supple.  Lymphadenopathy:     Cervical: No cervical adenopathy.  Neurological:     Mental Status: He is alert.  Lab Results  Component Value Date   WBC 7.1 02/12/2016   HGB 14.7 02/12/2016   HCT 41.8 02/12/2016   PLT 239 02/12/2016   GLUCOSE 92 02/12/2016   NA 136 02/12/2016   K 4.0 02/12/2016   CL 104 02/12/2016   CREATININE 1.04 02/12/2016   BUN 20 02/12/2016   CO2 27 02/12/2016     Assessment & Plan:   Problem List Items Addressed This Visit       Respiratory   Viral URI with cough - Primary    Acute illness with systemic symptoms (fever). Patient has been sick for the past 2 days.   Evidence of bacterial infection at this time. Awaiting COVID and flu test results. Promethazine DM for cough.  Advised Zyrtec-D for up respiratory symptoms.  Work note given.      Relevant Orders   COVID-19, Flu A+B and RSV    Meds ordered this encounter  Medications   promethazine-dextromethorphan (PROMETHAZINE-DM) 6.25-15 MG/5ML syrup    Sig: Take 5 mLs by mouth 4  (four) times daily as needed for cough.    Dispense:  118 mL    Refill:  0   Mosiah Bastin DO [facility]

## 2021-02-22 NOTE — Patient Instructions (Signed)
Lots of fluids.  Rest.  Zyrtec D (OTC).  Cough medication as directed.  Awaiting testing results.  Call with concerns.  Take care  Dr. Adriana Simas

## 2021-02-22 NOTE — Assessment & Plan Note (Addendum)
Acute illness with systemic symptoms (fever). Patient has been sick for the past 2 days.   Evidence of bacterial infection at this time. Awaiting COVID and flu test results. Promethazine DM for cough.  Advised Zyrtec-D for up respiratory symptoms.  Work note given.

## 2021-02-24 LAB — COVID-19, FLU A+B AND RSV
Influenza A, NAA: NOT DETECTED
Influenza B, NAA: NOT DETECTED
RSV, NAA: NOT DETECTED
SARS-CoV-2, NAA: NOT DETECTED

## 2021-02-27 ENCOUNTER — Telehealth: Payer: Self-pay | Admitting: Family Medicine

## 2021-02-27 MED ORDER — PREDNISONE 50 MG PO TABS: ORAL_TABLET | ORAL | 0 refills | Status: DC

## 2021-02-27 NOTE — Telephone Encounter (Signed)
Please advise. Thank you

## 2021-02-27 NOTE — Telephone Encounter (Signed)
Rx for prednisone was sent the pharmacy.  Dr. Adriana Simas

## 2021-02-27 NOTE — Telephone Encounter (Signed)
Patient was seen 10/13. Still has cough and congestions and asked if a steroid could be called in for him at [facility].    CB#  [phone]

## 2021-02-27 NOTE — Telephone Encounter (Signed)
Pt contacted and verbalized understanding.  

## 2022-04-08 ENCOUNTER — Telehealth: Payer: 59 | Admitting: Nurse Practitioner

## 2022-04-08 DIAGNOSIS — J029 Acute pharyngitis, unspecified: Secondary | ICD-10-CM

## 2022-04-08 DIAGNOSIS — R051 Acute cough: Secondary | ICD-10-CM

## 2022-04-08 MED ORDER — BENZONATATE 100 MG PO CAPS: 100.0000 mg | ORAL_CAPSULE | Freq: Three times a day (TID) | ORAL | 0 refills | Status: DC | PRN

## 2022-04-08 NOTE — Progress Notes (Signed)
E-Visit for Sore Throat  We are sorry that you are not feeling well.  Here is how we plan to help!  Your symptoms indicate a likely viral infection (Pharyngitis).   Pharyngitis is inflammation in the back of the throat which can cause a sore throat, scratchiness and sometimes difficulty swallowing.   Pharyngitis is typically caused by a respiratory virus and will just run its course.  Please keep in mind that your symptoms could last up to 10 days.  For throat pain, we recommend over the counter oral pain relief medications such as acetaminophen or aspirin, or anti-inflammatory medications such as ibuprofen or naproxen sodium.  Topical treatments such as oral throat lozenges or sprays may be used as needed.  Avoid close contact with loved ones, especially the very young and elderly.  Remember to wash your hands thoroughly throughout the day as this is the number one way to prevent the spread of infection and wipe down door knobs and counters with disinfectant.  We will send in medicine for your cough. Typically when you have a cough associated with sore throat, it is from a virus and does not require antibiotics.   Meds ordered this encounter  Medications   benzonatate (TESSALON) 100 MG capsule    Sig: Take 1 capsule (100 mg total) by mouth 3 (three) times daily as needed.    Dispense:  30 capsule    Refill:  0     Providers prescribe antibiotics to treat infections caused by bacteria. Antibiotics are very powerful in treating bacterial infections when they are used properly. To maintain their effectiveness, they should be used only when necessary. Overuse of antibiotics has resulted in the development of superbugs that are resistant to treatment!    After careful review of your answers, I would not recommend an antibiotic for your condition.  Antibiotics are not effective against viruses and therefore should not be used to treat them. Common examples of infections caused by viruses include colds  and flu   After careful review of your answers, I would not recommend an antibiotic for your condition.  Antibiotics should not be used to treat conditions that we suspect are caused by viruses like the virus that causes the common cold or flu. However, some people can have Strep with atypical symptoms. You may need formal testing in clinic or office to confirm if your symptoms continue or worsen.  Providers prescribe antibiotics to treat infections caused by bacteria. Antibiotics are very powerful in treating bacterial infections when they are used properly.  To maintain their effectiveness, they should be used only when necessary.  Overuse of antibiotics has resulted in the development of super bugs that are resistant to treatment!    Home Care: Only take medications as instructed by your medical team. Do not drink alcohol while taking these medications. A steam or ultrasonic humidifier can help congestion.  You can place a towel over your head and breathe in the steam from hot water coming from a faucet. Avoid close contacts especially the very young and the elderly. Cover your mouth when you cough or sneeze. Always remember to wash your hands.  Get Help Right Away If: You develop worsening fever or throat pain. You develop a severe head ache or visual changes. Your symptoms persist after you have completed your treatment plan.  Make sure you Understand these instructions. Will watch your condition. Will get help right away if you are not doing well or get worse.   Thank you  for choosing an e-visit.  Your e-visit answers were reviewed by a board certified advanced clinical practitioner to complete your personal care plan. Depending upon the condition, your plan could have included both over the counter or prescription medications.  Please review your pharmacy choice. Make sure the pharmacy is open so you can pick up prescription now. If there is a problem, you may contact your  provider through [employer] and have the prescription routed to another pharmacy.  Your safety is important to [place]. If you have drug allergies check your prescription carefully.   For the next 24 hours you can use MyChart to ask questions about today's visit, request a non-urgent call back, or ask for a work or school excuse. You will get an email in the next two days asking about your experience. I hope that your e-visit has been valuable and will speed your recovery.   I spent approximately 5 minutes reviewing the patient's history, current symptoms and coordinating their plan of care today.

## 2022-04-09 ENCOUNTER — Ambulatory Visit (INDEPENDENT_AMBULATORY_CARE_PROVIDER_SITE_OTHER): Payer: 59 | Admitting: Family Medicine

## 2022-04-09 VITALS — BP 130/83 | Temp 97.9°F | Ht 72.0 in | Wt 207.0 lb

## 2022-04-09 DIAGNOSIS — J020 Streptococcal pharyngitis: Secondary | ICD-10-CM | POA: Diagnosis not present

## 2022-04-09 LAB — POCT RAPID STREP A (OFFICE): Rapid Strep A Screen: POSITIVE — AB

## 2022-04-09 MED ORDER — AMOXICILLIN 500 MG PO TABS: 500.0000 mg | ORAL_TABLET | Freq: Two times a day (BID) | ORAL | 0 refills | Status: DC

## 2022-04-09 NOTE — Assessment & Plan Note (Signed)
Rapid strep positive.  Treating with amoxicillin. 

## 2022-04-09 NOTE — Progress Notes (Signed)
Subjective:  Patient ID: Aaron Powers, male    DOB: 1976/01/09  Age: 46 y.o. MRN: [id number]  CC: Chief Complaint  Patient presents with   Cough    Sore throat for 4 days    HPI:  46 year old male presents with sore throat.  Sore throat x 4 days.  Mild cough.  Sore throat is most predominant/troublesome symptom.  No fever.  No relieving factors.  Had an e-visit yesterday and was diagnosed with viral pharyngitis.  Was given Tessalon Perles.  No relief.  No other associated symptoms.  No other complaints.  Patient Active Problem List   Diagnosis Date Noted   Strep pharyngitis 04/09/2022   Seasonal allergic rhinitis due to pollen 08/22/2020   S/P left knee arthroscopy 02/28/2014    Social Hx   Social History   Socioeconomic History   Marital status: Married    Spouse name: Not on file   Number of children: Not on file   Years of education: Not on file   Highest education level: Not on file  Occupational History   Not on file  Tobacco Use   Smoking status: Never   Smokeless tobacco: Never  Substance and Sexual Activity   Alcohol use: No   Drug use: No   Sexual activity: Yes    Birth control/protection: None  Other Topics Concern   Not on file  Social History Narrative   Not on file   Social Determinants of Health   Financial Resource Strain: Not on file  Food Insecurity: Not on file  Transportation Needs: Not on file  Physical Activity: Not on file  Stress: Not on file  Social Connections: Not on file    Review of Systems Per HPI  Objective:  BP 130/83   Temp 97.9 F (36.6 C) (Oral)   Ht 6' (1.829 m)   Wt 207 lb (93.9 kg)   BMI 28.07 kg/m      04/09/2022   11:04 AM 02/22/2021    3:33 PM 11/10/2020    8:42 AM  BP/Weight  Systolic BP 130 122 145  Diastolic BP 83 82 82  Wt. (Lbs) 207 200.3   BMI 28.07 kg/m2 27.17 kg/m2     Physical Exam Constitutional:      General: He is not in acute distress.    Appearance: Normal appearance.   HENT:     Head: Normocephalic and atraumatic.     Mouth/Throat:     Pharynx: Posterior oropharyngeal erythema present. No oropharyngeal exudate.  Cardiovascular:     Rate and Rhythm: Normal rate and regular rhythm.  Pulmonary:     Effort: Pulmonary effort is normal.     Breath sounds: No wheezing, rhonchi or rales.  Musculoskeletal:     Cervical back: Neck supple.  Lymphadenopathy:     Cervical: No cervical adenopathy.  Neurological:     Mental Status: He is alert.  Psychiatric:        Mood and Affect: Mood normal.        Behavior: Behavior normal.     Lab Results  Component Value Date   WBC 7.1 02/12/2016   HGB 14.7 02/12/2016   HCT 41.8 02/12/2016   PLT 239 02/12/2016   GLUCOSE 92 02/12/2016   NA 136 02/12/2016   K 4.0 02/12/2016   CL 104 02/12/2016   CREATININE 1.04 02/12/2016   BUN 20 02/12/2016   CO2 27 02/12/2016     Assessment & Plan:   Problem List Items Addressed This  Visit       Respiratory   Strep pharyngitis - Primary    Rapid strep positive.  Treating with amoxicillin.      Relevant Orders   POCT rapid strep A (Completed)    Meds ordered this encounter  Medications   amoxicillin (AMOXIL) 500 MG tablet    Sig: Take 1 tablet (500 mg total) by mouth 2 (two) times daily.    Dispense:  20 tablet    Refill:  0    Follow-up:  Return if symptoms worsen or fail to improve.  Everlene Other DO [facility]

## 2022-04-11 ENCOUNTER — Telehealth: Payer: Self-pay | Admitting: Family Medicine

## 2022-04-11 ENCOUNTER — Other Ambulatory Visit: Payer: Self-pay | Admitting: Family Medicine

## 2022-04-11 MED ORDER — PROMETHAZINE-DM 6.25-15 MG/5ML PO SYRP: 5.0000 mL | ORAL_SOLUTION | Freq: Four times a day (QID) | ORAL | 0 refills | Status: DC | PRN

## 2022-04-11 MED ORDER — PREDNISONE 50 MG PO TABS: ORAL_TABLET | ORAL | 0 refills | Status: DC

## 2022-04-11 NOTE — Telephone Encounter (Signed)
Pt calling in wanting to know if provider will call in steroid and night time cough med. Pt states his cough has worsened since being seen on Tuesday (pt was treated for strep). Please advise. Thank you [employer]

## 2022-04-11 NOTE — Telephone Encounter (Signed)
Pt contacted and verbalized understanding.  

## 2022-04-30 ENCOUNTER — Other Ambulatory Visit: Payer: Self-pay | Admitting: Family Medicine

## 2022-04-30 ENCOUNTER — Telehealth: Payer: Self-pay | Admitting: Family Medicine

## 2022-04-30 MED ORDER — HYDROCODONE BIT-HOMATROP MBR 5-1.5 MG/5ML PO SOLN: 5.0000 mL | Freq: Three times a day (TID) | ORAL | 0 refills | Status: AC | PRN

## 2022-04-30 NOTE — Telephone Encounter (Signed)
Pt requesting script for steroids to help with cough. Pt was treated on 04/09/22 for strep. Pt states cough went away and then came back. No other symptoms. Non productive cough. Please advise. Thank you  [employer]

## 2022-04-30 NOTE — Telephone Encounter (Signed)
Pt contacted and verbalized understanding. Pt would like something sent in for cough. Please advise. Thank you

## 2023-10-13 ENCOUNTER — Ambulatory Visit: Admitting: Orthopedic Surgery
